# Patient Record
Sex: Female | Born: 1965 | Race: White | Hispanic: No | Smoking: Current every day smoker
Health system: Southern US, Community
[De-identification: ages and names within clinical notes are randomized; demographics above are authoritative.]

## PROBLEM LIST (undated history)

## (undated) DIAGNOSIS — Z8619 Personal history of other infectious and parasitic diseases: Secondary | ICD-10-CM

## (undated) DIAGNOSIS — M1990 Unspecified osteoarthritis, unspecified site: Secondary | ICD-10-CM

## (undated) DIAGNOSIS — K432 Incisional hernia without obstruction or gangrene: Secondary | ICD-10-CM

## (undated) DIAGNOSIS — I10 Essential (primary) hypertension: Secondary | ICD-10-CM

## (undated) DIAGNOSIS — M810 Age-related osteoporosis without current pathological fracture: Secondary | ICD-10-CM

## (undated) HISTORY — PX: BACK SURGERY: SHX140

## (undated) HISTORY — DX: Unspecified osteoarthritis, unspecified site: M19.90

## (undated) HISTORY — PX: CHOLECYSTECTOMY: SHX55

## (undated) HISTORY — DX: Essential (primary) hypertension: I10

---

## 2009-02-08 ENCOUNTER — Emergency Department (HOSPITAL_BASED_OUTPATIENT_CLINIC_OR_DEPARTMENT_OTHER): Admission: EM | Admit: 2009-02-08 | Discharge: 2009-02-08 | Payer: Self-pay | Admitting: Emergency Medicine

## 2010-05-29 ENCOUNTER — Emergency Department: Payer: Self-pay | Admitting: Emergency Medicine

## 2014-03-22 ENCOUNTER — Emergency Department (HOSPITAL_COMMUNITY)
Admission: EM | Admit: 2014-03-22 | Discharge: 2014-03-22 | Discharge status: Against Medical Advice | Payer: BC Managed Care – PPO | Attending: Emergency Medicine | Admitting: Emergency Medicine

## 2014-03-22 ENCOUNTER — Encounter (HOSPITAL_COMMUNITY): Payer: Self-pay

## 2014-03-22 DIAGNOSIS — R109 Unspecified abdominal pain: Secondary | ICD-10-CM | POA: Insufficient documentation

## 2014-03-22 DIAGNOSIS — Z720 Tobacco use: Secondary | ICD-10-CM | POA: Insufficient documentation

## 2014-03-22 NOTE — ED Notes (Signed)
Pt says she needs to leave, she will see md in the am.

## 2014-03-22 NOTE — ED Notes (Signed)
My stomach is hurting, I have blood in my stool per pt. It has been bothering me for two days.

## 2014-03-23 ENCOUNTER — Emergency Department (HOSPITAL_COMMUNITY)
Admission: EM | Admit: 2014-03-23 | Discharge: 2014-03-23 | Disposition: A | Discharge status: Home / Self Care | Payer: BC Managed Care – PPO | Attending: Emergency Medicine | Admitting: Emergency Medicine

## 2014-03-23 ENCOUNTER — Emergency Department (HOSPITAL_COMMUNITY): Payer: BC Managed Care – PPO

## 2014-03-23 ENCOUNTER — Encounter (HOSPITAL_COMMUNITY): Payer: Self-pay | Admitting: *Deleted

## 2014-03-23 DIAGNOSIS — R1031 Right lower quadrant pain: Secondary | ICD-10-CM | POA: Diagnosis present

## 2014-03-23 DIAGNOSIS — R109 Unspecified abdominal pain: Secondary | ICD-10-CM

## 2014-03-23 DIAGNOSIS — Z3202 Encounter for pregnancy test, result negative: Secondary | ICD-10-CM | POA: Insufficient documentation

## 2014-03-23 DIAGNOSIS — K469 Unspecified abdominal hernia without obstruction or gangrene: Secondary | ICD-10-CM | POA: Insufficient documentation

## 2014-03-23 DIAGNOSIS — K439 Ventral hernia without obstruction or gangrene: Secondary | ICD-10-CM

## 2014-03-23 DIAGNOSIS — R195 Other fecal abnormalities: Secondary | ICD-10-CM | POA: Insufficient documentation

## 2014-03-23 DIAGNOSIS — Z9049 Acquired absence of other specified parts of digestive tract: Secondary | ICD-10-CM | POA: Diagnosis not present

## 2014-03-23 DIAGNOSIS — R1010 Upper abdominal pain, unspecified: Secondary | ICD-10-CM

## 2014-03-23 DIAGNOSIS — Z720 Tobacco use: Secondary | ICD-10-CM | POA: Diagnosis not present

## 2014-03-23 DIAGNOSIS — M545 Low back pain: Secondary | ICD-10-CM | POA: Insufficient documentation

## 2014-03-23 LAB — COMPREHENSIVE METABOLIC PANEL
ALT: 14 U/L (ref 0–35)
AST: 17 U/L (ref 0–37)
Albumin: 3.7 g/dL (ref 3.5–5.2)
Alkaline Phosphatase: 84 U/L (ref 39–117)
Anion gap: 12 (ref 5–15)
BUN: 9 mg/dL (ref 6–23)
CALCIUM: 9.2 mg/dL (ref 8.4–10.5)
CO2: 26 meq/L (ref 19–32)
Chloride: 102 mEq/L (ref 96–112)
Creatinine, Ser: 0.63 mg/dL (ref 0.50–1.10)
GFR calc Af Amer: >90 mL/min (ref >90)
Glucose, Bld: 94 mg/dL (ref 70–99)
POTASSIUM: 4 meq/L (ref 3.7–5.3)
SODIUM: 140 meq/L (ref 137–147)
Total Bilirubin: 0.4 mg/dL (ref 0.3–1.2)
Total Protein: 7.4 g/dL (ref 6.0–8.3)

## 2014-03-23 LAB — URINALYSIS, ROUTINE W REFLEX MICROSCOPIC
Bilirubin Urine: NEGATIVE
Glucose, UA: NEGATIVE mg/dL
Ketones, ur: NEGATIVE mg/dL
Nitrite: POSITIVE — AB
PH: 7 (ref 5.0–8.0)
Protein, ur: NEGATIVE mg/dL
Specific Gravity, Urine: 1.01 (ref 1.005–1.030)
Urobilinogen, UA: 0.2 mg/dL (ref 0.0–1.0)

## 2014-03-23 LAB — POC URINE PREG, ED: PREG TEST UR: NEGATIVE

## 2014-03-23 LAB — I-STAT CHEM 8, ED
BUN: 7 mg/dL (ref 6–23)
Calcium, Ion: 1.14 mmol/L (ref 1.12–1.23)
Chloride: 103 mEq/L (ref 96–112)
Creatinine, Ser: 0.6 mg/dL (ref 0.50–1.10)
Glucose, Bld: 93 mg/dL (ref 70–99)
HCT: 46 % (ref 36.0–46.0)
Hemoglobin: 15.6 g/dL — ABNORMAL HIGH (ref 12.0–15.0)
Potassium: 3.8 mEq/L (ref 3.7–5.3)
Sodium: 138 mEq/L (ref 137–147)
TCO2: 25 mmol/L (ref 0–100)

## 2014-03-23 LAB — URINE MICROSCOPIC-ADD ON

## 2014-03-23 LAB — CBC WITH DIFFERENTIAL/PLATELET
Basophils Absolute: 0.1 10*3/uL (ref 0.0–0.1)
Basophils Relative: 1 % (ref 0–1)
Eosinophils Absolute: 0.3 10*3/uL (ref 0.0–0.7)
Eosinophils Relative: 4 % (ref 0–5)
HCT: 42.9 % (ref 36.0–46.0)
Hemoglobin: 14.9 g/dL (ref 12.0–15.0)
LYMPHS PCT: 27 % (ref 12–46)
Lymphs Abs: 1.8 10*3/uL (ref 0.7–4.0)
MCH: 32.6 pg (ref 26.0–34.0)
MCHC: 34.7 g/dL (ref 30.0–36.0)
MCV: 93.9 fL (ref 78.0–100.0)
Monocytes Absolute: 0.4 10*3/uL (ref 0.1–1.0)
Monocytes Relative: 6 % (ref 3–12)
NEUTROS PCT: 62 % (ref 43–77)
Neutro Abs: 4.3 10*3/uL (ref 1.7–7.7)
PLATELETS: 290 10*3/uL (ref 150–400)
RBC: 4.57 MIL/uL (ref 3.87–5.11)
RDW: 13.3 % (ref 11.5–15.5)
WBC: 6.8 10*3/uL (ref 4.0–10.5)

## 2014-03-23 LAB — TROPONIN I

## 2014-03-23 LAB — LIPASE, BLOOD: Lipase: 13 U/L (ref 11–59)

## 2014-03-23 LAB — I-STAT CG4 LACTIC ACID, ED: Lactic Acid, Venous: 0.54 mmol/L (ref 0.5–2.2)

## 2014-03-23 MED ORDER — SODIUM CHLORIDE 0.9 % IV BOLUS (SEPSIS)
1000.0000 mL | Freq: Once | INTRAVENOUS | Status: AC
  Administered 2014-03-23: 1000 mL via INTRAVENOUS

## 2014-03-23 MED ORDER — FENTANYL CITRATE 0.05 MG/ML IJ SOLN
100.0000 ug | Freq: Once | INTRAMUSCULAR | Status: AC
  Administered 2014-03-23: 100 ug via INTRAVENOUS
  Filled 2014-03-23: qty 2

## 2014-03-23 MED ORDER — IOHEXOL 300 MG/ML  SOLN
100.0000 mL | Freq: Once | INTRAMUSCULAR | Status: AC | PRN
  Administered 2014-03-23: 100 mL via INTRAVENOUS

## 2014-03-23 MED ORDER — NAPROXEN 500 MG PO TABS: 500.0000 mg | ORAL_TABLET | Freq: Two times a day (BID) | ORAL | Status: DC

## 2014-03-23 MED ORDER — HYDROCODONE-ACETAMINOPHEN 5-325 MG PO TABS: 2.0000 | ORAL_TABLET | ORAL | Status: DC | PRN

## 2014-03-23 NOTE — Discharge Instructions (Signed)

## 2014-03-23 NOTE — ED Notes (Addendum)
Pt co abdominal pain, lower back pain, and nausea, since Sunday. Seen for last same last night but had to leave. Pt seen at urgent care this morning and sent to ED for possible mass in abdomen. Pt describes pain as a dull ache.

## 2014-03-23 NOTE — ED Notes (Signed)
Patient given discharge instruction, verbalized understand. IV removed, band aid applied. Patient ambulatory out of the department.  

## 2014-03-23 NOTE — ED Provider Notes (Signed)
CSN: [id number]     Arrival date & time 03/23/14  0904 History  This chart was scribed for Jocelyn RollerBrian D Daking Westervelt, MD by Jocelyn [name], ED Scribe. This patient was seen in room APA14/APA14 and the patient's care was started at 9:45 AM.    Chief Complaint  Patient presents with  . Abdominal Pain   The history is provided by the patient. No language interpreter was used.     HPI Comments: Jocelyn [name] is a 48 y.o. female with past medical history of cholecystectomy 8 years PTA who presents to the Emergency Department complaining of 4 days of abdominal pain, lower back pain, and nausea associated with a knot in her mid-upper abdomen. She describes her abdominal pain as a dull ache. Pain has been persistent, gradually worsening. She reports one episode of rectal bleeding, occuring yesterday. She had a normal stool after that incident.   She was seen here last night for the same symptoms but "had to leave" - she visited Urgent Care this morning and was sent to the ED for a possible abdominal mass.   History reviewed. No pertinent past medical history. History reviewed. No pertinent past surgical history. History reviewed. No pertinent family history. History  Substance Use Topics  . Smoking status: Current Every Day Smoker  . Smokeless tobacco: Not on file  . Alcohol Use: No   OB History    No data available     Review of Systems  Gastrointestinal: Positive for nausea, abdominal pain and blood in stool.  All other systems reviewed and are negative.  Allergies  Tetracyclines & related  Home Medications   Prior to Admission medications   Medication Sig Start Date End Date Taking? Authorizing Provider  ibuprofen (ADVIL,MOTRIN) 200 MG tablet Take 600-800 mg by mouth every 6 (six) hours as needed for moderate pain.   Yes Historical Provider, MD  HYDROcodone-acetaminophen (NORCO/VICODIN) 5-325 MG per tablet Take 2 tablets by mouth every 4 (four) hours as needed. 03/23/14   Jocelyn RollerBrian D Jocelyn Estey, MD   naproxen (NAPROSYN) 500 MG tablet Take 1 tablet (500 mg total) by mouth 2 (two) times daily with a meal. 03/23/14   Jocelyn RollerBrian D Jocelyn Sperling, MD   BP 122/88 mmHg  Pulse 80  Temp(Src) 97.7 F (36.5 C) (Oral)  Resp 16  Ht 5\' 4"  (1.626 m)  Wt 143 lb (64.864 kg)  BMI 24.53 kg/m2  SpO2 100% Physical Exam  Constitutional: She is oriented to person, place, and time. She appears well-developed and well-nourished. No distress.  HENT:  Head: Normocephalic and atraumatic.  Mouth/Throat: Oropharynx is clear and moist. No oropharyngeal exudate.  Eyes: Conjunctivae and EOM are normal. Pupils are equal, round, and reactive to light. Right eye exhibits no discharge. Left eye exhibits no discharge. No scleral icterus.  Neck: Normal range of motion. Neck supple. No JVD present. No tracheal deviation present. No thyromegaly present.  Cardiovascular: Normal rate, regular rhythm, normal heart sounds and intact distal pulses.  Exam reveals no gallop and no friction rub.   No murmur heard. Pulmonary/Chest: Effort normal and breath sounds normal. No respiratory distress. She has no wheezes. She has no rales.  Abdominal: Soft. Bowel sounds are normal. She exhibits no distension and no mass. There is tenderness. There is guarding.  Supraumbilical mass which is hard and tender; mild RLQ and RU tenderness as well, no left-sided tenderness, guarding present, not peritoneal   Musculoskeletal: Normal range of motion. She exhibits no edema or tenderness.  Lymphadenopathy:  She has no cervical adenopathy.  Neurological: She is alert and oriented to person, place, and time. Coordination normal.  Skin: Skin is warm and dry. No rash noted. No erythema.  Psychiatric: She has a normal mood and affect. Her behavior is normal.  Nursing note and vitals reviewed.  ED Course  Procedures   DIAGNOSTIC STUDIES: Oxygen Saturation is 100% on RA, normal by my interpretation.    COORDINATION OF CARE: 9:56 AM Discussed treatment plan  with pt at bedside and pt agreed to plan.   Labs Review Labs Reviewed  URINALYSIS, ROUTINE W REFLEX MICROSCOPIC - Abnormal; Notable for the following:    Hgb urine dipstick TRACE (*)    Nitrite POSITIVE (*)    Leukocytes, UA TRACE (*)    All other components within normal limits  URINE MICROSCOPIC-ADD ON - Abnormal; Notable for the following:    Bacteria, UA MANY (*)    All other components within normal limits  I-STAT CHEM 8, ED - Abnormal; Notable for the following:    Hemoglobin 15.6 (*)    All other components within normal limits  CBC WITH DIFFERENTIAL  COMPREHENSIVE METABOLIC PANEL  LIPASE, BLOOD  TROPONIN I  POC URINE PREG, ED  I-STAT CHEM 8, ED  I-STAT CG4 LACTIC ACID, ED    Imaging Review Ct Abdomen Pelvis W Contrast  03/23/2014   CLINICAL DATA:  Lower back pain with nausea.  Rectal bleeding.  EXAM: CT ABDOMEN AND PELVIS WITH CONTRAST  TECHNIQUE: Multidetector CT imaging of the abdomen and pelvis was performed using the standard protocol following bolus administration of intravenous contrast.  CONTRAST:  OMNIPAQUE IOHEXOL 300 MG/ML  SOLN  COMPARISON:  None.  FINDINGS: The lung bases are clear.  The liver demonstrates no focal abnormality. There is no intrahepatic or extrahepatic biliary ductal dilatation. The gallbladder is surgically absent. The spleen demonstrates no focal abnormality.There is a 12 mm hypodense fluid attenuating right renal mass most consistent with a cyst. There is a 3 x 1.5 cm hypodense, fluid attenuating left renal posterior interpolar mass most consistent with a cyst. The adrenal glands and pancreas are normal. The bladder is unremarkable.  The stomach, duodenum, small intestine, and large intestine demonstrate no wall thickening or bowel dilatation. There is diverticulosis without evidence of diverticulitis. There is a normal caliber appendix in the right lower quadrant without periappendiceal inflammatory changes.There is a small fat containing  umbilical hernia. There is no pneumoperitoneum, pneumatosis, or portal venous gas. There is no abdominal or pelvic free fluid. There is no lymphadenopathy.  The abdominal aorta is normal in caliber with atherosclerosis.  There are no lytic or sclerotic osseous lesions. There is degenerative disc disease at L5-S1 with disc space narrowing a broad-based disc bulge.  IMPRESSION: 1. Diverticulosis without evidence of diverticulitis. 2. Small fat containing umbilical hernia.   Electronically Signed   By: Elige Ko   On: 03/23/2014 10:34      MDM   Final diagnoses:  Abdominal pain  Hernia of anterior abdominal wall    The patient is well-appearing, she is in no acute distress, she does not have a surgical abdomen, her laboratory workup shows no leukocytosis, normal lactic acid, normal renal function and no anemia. Her CT scan shows that she has had small piece of herniated fat in the periumbilical region. I have discussed this with Dr. Lovell Sheehan who agrees that this does not need to be reduced and that she can follow-up in the general surgeon's office for follow-up with  pain control but no need for antibiotics. This will be explained to the patient, she otherwise appears stable.   I personally performed the services described in this documentation, which was scribed in my presence. The recorded information has been reviewed and is accurate.     Jocelyn RollerBrian D Jamisen Hawes, MD 03/24/14 [id number]

## 2014-04-13 NOTE — H&P (Signed)
  NTS SOAP Note  Vital Signs:  Vitals as of: 04/12/2014: Systolic 137: Diastolic 81: Heart Rate 83: Temp 98.72F: Height 25ft 4in: Weight 166Lbs 0 Ounces: BMI 28.49  BMI : 28.49 kg/m2  Subjective: This 48 year old female presents for of a swelling just above the umbilicus.  Has been present for some time,  but is increasing in size and causing her discomfort.  Made worse with straining.  Had surgery thru umbilicus for cholecystectomy.  Review of Symptoms:  Constitutional:unremarkable   headache Eyes:unremarkable   Nose/Mouth/Throat:unremarkable Cardiovascular:  unremarkable Respiratory:cough Gastrointestinabdominal pain, heartburn Genitourinary:frequency back pain Skin:unremarkable Hematolgic/Lymphatic:unremarkable   Allergic/Immunologic:unremarkable   Past Medical History:  Reviewed  Past Medical History  Surgical History: lap cholecystectomy 2007 Medical Problems: none Allergies: tetracycline Medications: motrin,  vicodin   Social History:Reviewed  Social History  Preferred Language: English Race:  White Ethnicity: Not Hispanic / Latino Age: [age] year Marital Status:  S Alcohol: unknown   Smoking Status: Never smoker reviewed on 04/12/2014 Functional Status reviewed on 04/12/2014 ------------------------------------------------ Bathing: Normal Cooking: Normal Dressing: Normal Driving: Normal Eating: Normal Managing Meds: Normal Oral Care: Normal Shopping: Normal Toileting: Normal Transferring: Normal Walking: Normal Cognitive Status reviewed on 04/12/2014 ------------------------------------------------ Attention: Normal Decision Making: Normal Language: Normal Memory: Normal Motor: Normal Perception: Normal Problem Solving: Normal Visual and Spatial: Normal   Family History:Reviewed  Family Health History Mother  Father, Living; Hypertension (high blood pressure);     Objective Information: General:Well appearing, well  nourished in no distress. Heart:RRR, no murmur or gallop.  Normal S1, S2.  No S3, S4.  Lungs:  CTA bilaterally, no wheezes, rhonchi, rales.  Breathing unlabored. Abdomen:Soft, NT/ND, no HSM, no masses.  Reducible periumbilical hernia present along with a surgical scar.  Assessment:Incisional hernia  Diagnoses: 553.21  K43.2 Incisional hernia (Incisional hernia without obstruction or gangrene)  Procedures: [id number] - OFFICE OUTPATIENT NEW 30 MINUTES    Plan:  Scheduled for incisional herniorrhaphy with mesh on 04/27/14.   Patient Education:Alternative treatments to surgery were discussed with patient (and family).  Risks and benefits  of procedure including bleeding,  infection,  mesh use,  and recurrence of the hernia were fully explained to the patient (and family) who gave informed consent. Patient/family questions were addressed.  Follow-up:Pending Surgery

## 2014-04-20 NOTE — Patient Instructions (Signed)
Jocelyn BogaDee A Courington  04/20/2014   Your procedure is scheduled on:  04/27/2014  [facility]  AM.  Call this number if you have problems the morning of surgery: [phone]   Remember:   Do not eat food or drink liquids after midnight.   Take these medicines the morning of surgery with A SIP OF WATER:  hydrocodone   Do not wear jewelry, make-up or nail polish.  Do not wear lotions, powders, or perfumes.   Do not shave 48 hours prior to surgery. Men may shave face and neck.  Do not bring valuables to the hospital.  [facility] is not responsible for any belongings or valuables.               Contacts, dentures or bridgework may not be worn into surgery.  Leave suitcase in the car. After surgery it may be brought to your room.  For patients admitted to the hospital, discharge time is determined by your treatment team.               Patients discharged the day of surgery will not be allowed to drive home.  Name and phone number of your driver: family  Special Instructions: Shower using CHG 2 nights before surgery and the night before surgery.  If you shower the day of surgery use CHG.  Use special wash - you have one bottle of CHG for all showers.  You should use approximately 1/3 of the bottle for each shower.   Please read over the following fact sheets that you were given: Pain Booklet, Coughing and Deep Breathing, Surgical Site Infection Prevention, Anesthesia Post-op Instructions and Care and Recovery After Surgery Hernia A hernia occurs when an internal organ pushes out through a weak spot in the abdominal wall. Hernias most commonly occur in the groin and around the navel. Hernias often can be pushed back into place (reduced). Most hernias tend to get worse over time. Some abdominal hernias can get stuck in the opening (irreducible or incarcerated hernia) and cannot be reduced. An irreducible abdominal hernia which is tightly squeezed into the opening is at risk for  impaired blood supply (strangulated hernia). A strangulated hernia is a medical emergency. Because of the risk for an irreducible or strangulated hernia, surgery may be recommended to repair a hernia. CAUSES   Heavy lifting.  Prolonged coughing.  Straining to have a bowel movement.  A cut (incision) made during an abdominal surgery. HOME CARE INSTRUCTIONS   Bed rest is not required. You may continue your normal activities.  Avoid lifting more than 10 pounds (4.5 kg) or straining.  Cough gently. If you are a smoker it is best to stop. Even the best hernia repair can break down with the continual strain of coughing. Even if you do not have your hernia repaired, a cough will continue to aggravate the problem.  Do not wear anything tight over your hernia. Do not try to keep it in with an outside bandage or truss. These can damage abdominal contents if they are trapped within the hernia sac.  Eat a normal diet.  Avoid constipation. Straining over long periods of time will increase hernia size and encourage breakdown of repairs. If you cannot do this with diet alone, stool softeners may be used. SEEK IMMEDIATE MEDICAL CARE IF:   You have a fever.  You develop increasing abdominal pain.  You feel nauseous or vomit.  Your hernia is stuck  outside the abdomen, looks discolored, feels hard, or is tender.  You have any changes in your bowel habits or in the hernia that are unusual for you.  You have increased pain or swelling around the hernia.  You cannot push the hernia back in place by applying gentle pressure while lying down. MAKE SURE YOU:   Understand these instructions.  Will watch your condition.  Will get help right away if you are not doing well or get worse. Document Released: 04/22/2005 Document Revised: 07/15/2011 Document Reviewed: 12/10/2007 [facility] Patient Information 2015 [place], [place]. This information is not intended to replace advice given to you by your  health care provider. Make sure you discuss any questions you have with your health care provider. PATIENT INSTRUCTIONS POST-ANESTHESIA  IMMEDIATELY FOLLOWING SURGERY:  Do not drive or operate machinery for the first twenty four hours after surgery.  Do not make any important decisions for twenty four hours after surgery or while taking narcotic pain medications or sedatives.  If you develop intractable nausea and vomiting or a severe headache please notify your doctor immediately.  FOLLOW-UP:  Please make an appointment with your surgeon as instructed. You do not need to follow up with anesthesia unless specifically instructed to do so.  WOUND CARE INSTRUCTIONS (if applicable):  Keep a dry clean dressing on the anesthesia/puncture wound site if there is drainage.  Once the wound has quit draining you may leave it open to air.  Generally you should leave the bandage intact for twenty four hours unless there is drainage.  If the epidural site drains for more than 36-48 hours please call the anesthesia department.  QUESTIONS?:  Please feel free to call your physician or the hospital operator if you have any questions, and they will be happy to assist you.

## 2014-04-21 ENCOUNTER — Encounter (HOSPITAL_COMMUNITY)
Admission: RE | Admit: 2014-04-21 | Discharge: 2014-04-21 | Disposition: A | Discharge status: Home / Self Care | Payer: BC Managed Care – PPO | Source: Ambulatory Visit | Attending: General Surgery | Admitting: General Surgery

## 2014-04-21 ENCOUNTER — Encounter (HOSPITAL_COMMUNITY): Payer: Self-pay

## 2014-04-21 DIAGNOSIS — Z01812 Encounter for preprocedural laboratory examination: Secondary | ICD-10-CM | POA: Diagnosis not present

## 2014-04-21 LAB — CBC WITH DIFFERENTIAL/PLATELET
BASOS PCT: 1 % (ref 0–1)
Basophils Absolute: 0.1 10*3/uL (ref 0.0–0.1)
EOS ABS: 0.3 10*3/uL (ref 0.0–0.7)
Eosinophils Relative: 4 % (ref 0–5)
HCT: 40.2 % (ref 36.0–46.0)
Hemoglobin: 13.7 g/dL (ref 12.0–15.0)
Lymphocytes Relative: 30 % (ref 12–46)
Lymphs Abs: 2.2 10*3/uL (ref 0.7–4.0)
MCH: 32.2 pg (ref 26.0–34.0)
MCHC: 34.1 g/dL (ref 30.0–36.0)
MCV: 94.4 fL (ref 78.0–100.0)
MONOS PCT: 6 % (ref 3–12)
Monocytes Absolute: 0.5 10*3/uL (ref 0.1–1.0)
NEUTROS PCT: 59 % (ref 43–77)
Neutro Abs: 4.3 10*3/uL (ref 1.7–7.7)
Platelets: 268 10*3/uL (ref 150–400)
RBC: 4.26 MIL/uL (ref 3.87–5.11)
RDW: 13.2 % (ref 11.5–15.5)
WBC: 7.3 10*3/uL (ref 4.0–10.5)

## 2014-04-21 LAB — BASIC METABOLIC PANEL
Anion gap: 12 (ref 5–15)
BUN: 14 mg/dL (ref 6–23)
CO2: 24 mEq/L (ref 19–32)
CREATININE: 0.66 mg/dL (ref 0.50–1.10)
Calcium: 8.7 mg/dL (ref 8.4–10.5)
Chloride: 105 mEq/L (ref 96–112)
GFR calc non Af Amer: >90 mL/min (ref >90)
Glucose, Bld: 94 mg/dL (ref 70–99)
Potassium: 3.7 mEq/L (ref 3.7–5.3)
Sodium: 141 mEq/L (ref 137–147)

## 2014-04-21 LAB — HCG, SERUM, QUALITATIVE: Preg, Serum: NEGATIVE

## 2014-04-21 NOTE — Pre-Procedure Instructions (Signed)
Patient given information to sign up for my chart at home. 

## 2014-04-27 ENCOUNTER — Encounter (HOSPITAL_COMMUNITY)
Admission: RE | Disposition: A | Discharge status: Home / Self Care | Payer: Self-pay | Source: Ambulatory Visit | Attending: General Surgery

## 2014-04-27 ENCOUNTER — Ambulatory Visit (HOSPITAL_COMMUNITY): Payer: BC Managed Care – PPO | Admitting: Anesthesiology

## 2014-04-27 ENCOUNTER — Ambulatory Visit (HOSPITAL_COMMUNITY)
Admission: RE | Admit: 2014-04-27 | Discharge: 2014-04-27 | Disposition: A | Discharge status: Home / Self Care | Payer: BC Managed Care – PPO | Source: Ambulatory Visit | Attending: General Surgery | Admitting: General Surgery

## 2014-04-27 ENCOUNTER — Encounter (HOSPITAL_COMMUNITY): Payer: Self-pay | Admitting: *Deleted

## 2014-04-27 DIAGNOSIS — K432 Incisional hernia without obstruction or gangrene: Secondary | ICD-10-CM | POA: Insufficient documentation

## 2014-04-27 HISTORY — PX: INSERTION OF MESH: SHX5868

## 2014-04-27 HISTORY — PX: INCISIONAL HERNIA REPAIR: SHX193

## 2014-04-27 SURGERY — REPAIR, HERNIA, INCISIONAL
Anesthesia: General | Site: Abdomen

## 2014-04-27 MED ORDER — NEOSTIGMINE METHYLSULFATE 10 MG/10ML IV SOLN
INTRAVENOUS | Status: AC
  Filled 2014-04-27: qty 1

## 2014-04-27 MED ORDER — BUPIVACAINE HCL (PF) 0.5 % IJ SOLN
INTRAMUSCULAR | Status: AC
  Filled 2014-04-27: qty 30

## 2014-04-27 MED ORDER — KETOROLAC TROMETHAMINE 30 MG/ML IJ SOLN
INTRAMUSCULAR | Status: AC
  Filled 2014-04-27: qty 1

## 2014-04-27 MED ORDER — GLYCOPYRROLATE 0.2 MG/ML IJ SOLN
INTRAMUSCULAR | Status: DC | PRN
  Administered 2014-04-27: .3 mg via INTRAVENOUS

## 2014-04-27 MED ORDER — ONDANSETRON HCL 4 MG/2ML IJ SOLN
INTRAMUSCULAR | Status: AC
  Filled 2014-04-27: qty 2

## 2014-04-27 MED ORDER — NEOSTIGMINE METHYLSULFATE 10 MG/10ML IV SOLN
INTRAVENOUS | Status: DC | PRN
  Administered 2014-04-27: 2 mg via INTRAVENOUS

## 2014-04-27 MED ORDER — FENTANYL CITRATE 0.05 MG/ML IJ SOLN
25.0000 ug | INTRAMUSCULAR | Status: DC | PRN
  Administered 2014-04-27 (×2): 50 ug via INTRAVENOUS

## 2014-04-27 MED ORDER — POVIDONE-IODINE 10 % EX OINT
TOPICAL_OINTMENT | CUTANEOUS | Status: AC
  Filled 2014-04-27: qty 1

## 2014-04-27 MED ORDER — FENTANYL CITRATE 0.05 MG/ML IJ SOLN
INTRAMUSCULAR | Status: AC
  Filled 2014-04-27: qty 2

## 2014-04-27 MED ORDER — LIDOCAINE HCL (PF) 1 % IJ SOLN
INTRAMUSCULAR | Status: AC
  Filled 2014-04-27: qty 5

## 2014-04-27 MED ORDER — KETOROLAC TROMETHAMINE 30 MG/ML IJ SOLN
30.0000 mg | Freq: Once | INTRAMUSCULAR | Status: AC
  Administered 2014-04-27: 30 mg via INTRAVENOUS

## 2014-04-27 MED ORDER — PROPOFOL 10 MG/ML IV EMUL
INTRAVENOUS | Status: AC
  Filled 2014-04-27: qty 20

## 2014-04-27 MED ORDER — LIDOCAINE HCL 1 % IJ SOLN
INTRAMUSCULAR | Status: DC | PRN
  Administered 2014-04-27: 40 mg via INTRADERMAL

## 2014-04-27 MED ORDER — CHLORHEXIDINE GLUCONATE 4 % EX LIQD: 1.0000 "application " | Freq: Once | CUTANEOUS | Status: DC

## 2014-04-27 MED ORDER — SODIUM CHLORIDE 0.9 % IR SOLN
Status: DC | PRN
  Administered 2014-04-27: 1000 mL

## 2014-04-27 MED ORDER — ROCURONIUM BROMIDE 50 MG/5ML IV SOLN
INTRAVENOUS | Status: AC
  Filled 2014-04-27: qty 1

## 2014-04-27 MED ORDER — LACTATED RINGERS IV SOLN
INTRAVENOUS | Status: DC
  Administered 2014-04-27: 1000 mL via INTRAVENOUS

## 2014-04-27 MED ORDER — ONDANSETRON HCL 4 MG/2ML IJ SOLN: 4.0000 mg | Freq: Once | INTRAMUSCULAR | Status: DC | PRN

## 2014-04-27 MED ORDER — MIDAZOLAM HCL 2 MG/2ML IJ SOLN
INTRAMUSCULAR | Status: AC
  Filled 2014-04-27: qty 2

## 2014-04-27 MED ORDER — SUCCINYLCHOLINE CHLORIDE 20 MG/ML IJ SOLN
INTRAMUSCULAR | Status: DC | PRN
  Administered 2014-04-27: 100 mg via INTRAVENOUS

## 2014-04-27 MED ORDER — FENTANYL CITRATE 0.05 MG/ML IJ SOLN
INTRAMUSCULAR | Status: AC
  Filled 2014-04-27: qty 5

## 2014-04-27 MED ORDER — MIDAZOLAM HCL 2 MG/2ML IJ SOLN
1.0000 mg | INTRAMUSCULAR | Status: DC | PRN
  Administered 2014-04-27: 2 mg via INTRAVENOUS

## 2014-04-27 MED ORDER — FENTANYL CITRATE 0.05 MG/ML IJ SOLN
INTRAMUSCULAR | Status: DC | PRN
  Administered 2014-04-27 (×7): 50 ug via INTRAVENOUS

## 2014-04-27 MED ORDER — ROCURONIUM BROMIDE 100 MG/10ML IV SOLN
INTRAVENOUS | Status: DC | PRN
  Administered 2014-04-27: 20 mg via INTRAVENOUS
  Administered 2014-04-27: 5 mg via INTRAVENOUS

## 2014-04-27 MED ORDER — CEFAZOLIN SODIUM-DEXTROSE 2-3 GM-% IV SOLR
INTRAVENOUS | Status: AC
  Filled 2014-04-27: qty 50

## 2014-04-27 MED ORDER — PROPOFOL 10 MG/ML IV BOLUS
INTRAVENOUS | Status: DC | PRN
  Administered 2014-04-27: 140 mg via INTRAVENOUS

## 2014-04-27 MED ORDER — OXYCODONE-ACETAMINOPHEN 7.5-325 MG PO TABS: 1.0000 | ORAL_TABLET | ORAL | Status: DC | PRN

## 2014-04-27 MED ORDER — BUPIVACAINE HCL (PF) 0.5 % IJ SOLN
INTRAMUSCULAR | Status: DC | PRN
  Administered 2014-04-27: 7 mL

## 2014-04-27 MED ORDER — CEFAZOLIN SODIUM-DEXTROSE 2-3 GM-% IV SOLR
2.0000 g | INTRAVENOUS | Status: AC
  Administered 2014-04-27: 2 g via INTRAVENOUS

## 2014-04-27 MED ORDER — ONDANSETRON HCL 4 MG/2ML IJ SOLN
4.0000 mg | Freq: Once | INTRAMUSCULAR | Status: AC
  Administered 2014-04-27: 4 mg via INTRAVENOUS

## 2014-04-27 MED ORDER — POVIDONE-IODINE 10 % OINT PACKET
TOPICAL_OINTMENT | CUTANEOUS | Status: DC | PRN
  Administered 2014-04-27: 1 via TOPICAL

## 2014-04-27 MED ORDER — GLYCOPYRROLATE 0.2 MG/ML IJ SOLN
INTRAMUSCULAR | Status: AC
Start: 2014-04-27 — End: 2014-04-27
  Filled 2014-04-27: qty 2

## 2014-04-27 MED ORDER — SUCCINYLCHOLINE CHLORIDE 20 MG/ML IJ SOLN
INTRAMUSCULAR | Status: AC
  Filled 2014-04-27: qty 1

## 2014-04-27 SURGICAL SUPPLY — 44 items
BAG HAMPER (MISCELLANEOUS) ×3 IMPLANT
CHLORAPREP W/TINT 26ML (MISCELLANEOUS) ×3 IMPLANT
CLOTH BEACON ORANGE TIMEOUT ST (SAFETY) ×3 IMPLANT
COVER LIGHT HANDLE STERIS (MISCELLANEOUS) ×6 IMPLANT
DECANTER SPIKE VIAL GLASS SM (MISCELLANEOUS) ×2 IMPLANT
ELECT REM PT RETURN 9FT ADLT (ELECTROSURGICAL) ×3
ELECTRODE REM PT RTRN 9FT ADLT (ELECTROSURGICAL) ×1 IMPLANT
GAUZE SPONGE 4X4 12PLY STRL (GAUZE/BANDAGES/DRESSINGS) ×3 IMPLANT
GLOVE BIO SURGEON STRL SZ7 (GLOVE) ×2 IMPLANT
GLOVE BIOGEL PI IND STRL 7.0 (GLOVE) IMPLANT
GLOVE BIOGEL PI IND STRL 7.5 (GLOVE) IMPLANT
GLOVE BIOGEL PI IND STRL 8 (GLOVE) IMPLANT
GLOVE BIOGEL PI INDICATOR 7.0 (GLOVE) ×2
GLOVE BIOGEL PI INDICATOR 7.5 (GLOVE) ×4
GLOVE BIOGEL PI INDICATOR 8 (GLOVE) ×4
GLOVE SURG SS PI 7.5 STRL IVOR (GLOVE) ×7 IMPLANT
GOWN STRL REUS W/ TWL XL LVL3 (GOWN DISPOSABLE) ×1 IMPLANT
GOWN STRL REUS W/TWL LRG LVL3 (GOWN DISPOSABLE) ×8 IMPLANT
GOWN STRL REUS W/TWL XL LVL3 (GOWN DISPOSABLE) ×3
INST SET MAJOR GENERAL (KITS) ×3 IMPLANT
KIT ROOM TURNOVER APOR (KITS) ×3 IMPLANT
MANIFOLD NEPTUNE II (INSTRUMENTS) ×3 IMPLANT
MESH VENTRALEX ST 1-7/10 CRC S (Mesh General) ×2 IMPLANT
NDL HYPO 25X1 1.5 SAFETY (NEEDLE) ×1 IMPLANT
NEEDLE HYPO 25X1 1.5 SAFETY (NEEDLE) ×3 IMPLANT
NS IRRIG 1000ML POUR BTL (IV SOLUTION) ×3 IMPLANT
PACK ABDOMINAL MAJOR (CUSTOM PROCEDURE TRAY) ×3 IMPLANT
PAD ARMBOARD 7.5X6 YLW CONV (MISCELLANEOUS) ×3 IMPLANT
SET BASIN LINEN APH (SET/KITS/TRAYS/PACK) ×3 IMPLANT
STAPLER VISISTAT (STAPLE) ×2 IMPLANT
SUT ETHIBOND NAB MO 7 #0 18IN (SUTURE) ×2 IMPLANT
SUT NOVA NAB GS-21 1 T12 (SUTURE) IMPLANT
SUT NOVA NAB GS-22 2 2-0 T-19 (SUTURE) IMPLANT
SUT NOVA NAB GS-26 0 60 (SUTURE) IMPLANT
SUT PROLENE 0 CT 1 CR/8 (SUTURE) IMPLANT
SUT SILK 2 0 (SUTURE)
SUT SILK 2-0 18XBRD TIE 12 (SUTURE) IMPLANT
SUT VIC AB 2-0 CT1 27 (SUTURE) ×3
SUT VIC AB 2-0 CT1 TAPERPNT 27 (SUTURE) ×1 IMPLANT
SUT VIC AB 3-0 SH 27 (SUTURE) ×3
SUT VIC AB 3-0 SH 27X BRD (SUTURE) ×1 IMPLANT
SUT VIC AB 4-0 PS2 27 (SUTURE) IMPLANT
SYR 20CC LL (SYRINGE) ×3 IMPLANT
TAPE CLOTH SURG 4X10 WHT LF (GAUZE/BANDAGES/DRESSINGS) ×2 IMPLANT

## 2014-04-27 NOTE — Op Note (Signed)
Patient:  Jocelyn [name]  DOB:  11/06/1965  MRN:  [id number]   Preop Diagnosis:  Incisional hernia  Postop Diagnosis:  Same  Procedure:  Incisional herniorrhaphy with mesh  Surgeon:  Franky MachoMark Quentin Shorey, M.D.  Anes:  Gen.   Indications:  Patient is a 48 year old white female status post a laparoscopic cholecystectomy in the past at another facility who presents with an umbilical hernia at the surgical site. The risks and benefits of the procedure including bleeding, infection, recurrence of the hernia were fully explained to the patient, who gave informed consent.  Procedure note:  The patient was placed the supine position. After general anesthesia was administered, the abdomen was prepped and draped using usual sterile technique with ChloraPrep. Surgical site confirmation was performed.  An infraumbilical incision was made down to the fascia. The umbilicus was freed away from the underlying fascia. A 1 cm hernia defect was noted. Pro peritoneal fat as well as omentum were freed away from the edges and reduced. A 4.3 cm Bard Ventralex st patch was then inserted and secured to the fascia using 0 Ethibond interrupted sutures. The overlying fascia was reapproximated transversely using 0 Ethibond interrupted sutures. The base the umbilicus was secured back to the fascia using a 2-0 Vicryl suture. 0.5% Sensorcaine was instilled the surrounding wound. The incision was closed using staples. Betadine ointment dry sterile dressing were applied.  All tape and needle counts were correct at the end of the procedure. Patient was awakened and transferred to PACU in stable condition.  Complications:  None  EBL:  Minimal  Specimen:  None

## 2014-04-27 NOTE — Discharge Instructions (Signed)
Open Hernia Repair, Care After °Refer to this sheet in the next few weeks. These instructions provide you with information on caring for yourself after your procedure. Your health care provider may also give you more specific instructions. Your treatment has been planned according to current medical practices, but problems sometimes occur. Call your health care provider if you have any problems or questions after your procedure. °WHAT TO EXPECT AFTER THE PROCEDURE °After your procedure, it is typical to have the following: °· Pain in your abdomen, especially along your incision. You will be given pain medicines to control the pain. °· Constipation. You may be given a stool softener to help prevent this. °HOME CARE INSTRUCTIONS  °· Only take over-the-counter or prescription medicines as directed by your health care provider. °· Keep the wound dry and clean. You may wash the wound gently with soap and water 48 hours after surgery. Gently blot or dab the wound dry. Do not take baths, use swimming pools, or use hot tubs for 10 days or until your health care provider approves. °· Change bandages (dressings) as directed by your health care provider. °· Continue your normal diet as directed by your health care provider. Eat plenty of fruits and vegetables to help prevent constipation. °· Drink enough fluids to keep your urine clear or pale yellow. This also helps prevent constipation. °· Do not drive until your health care provider says it is okay. °· Do not lift anything heavier than 10 pounds (4.5 kg) or play contact sports for 4 weeks or until your health care provider approves. °· Follow up with your health care provider as directed. Ask your health care provider when to make an appointment to have your stitches (sutures) or staples removed. °SEEK MEDICAL CARE IF:  °· You have increased bleeding coming from the incision site. °· You have blood in your stool. °· You have increasing pain in the wound. °· You see redness  or swelling in the wound. °· You have fluid (pus) coming from the wound. °· You have a fever. °· You notice a bad smell coming from the wound or dressing. °SEEK IMMEDIATE MEDICAL CARE IF:  °· You develop a rash. °· You have chest pain or shortness of breath. °· You feel lightheaded or feel faint. °Document Released: 11/09/2004 Document Revised: 02/10/2013 Document Reviewed: 12/02/2012 °[employer]® Patient Information ©2015 [employer]. This information is not intended to replace advice given to you by your health care provider. Make sure you discuss any questions you have with your health care provider. ° °

## 2014-04-27 NOTE — Anesthesia Procedure Notes (Signed)
Procedure Name: Intubation Date/Time: 04/27/2014 8:34 AM Performed by: Glynn OctaveANIEL, Jocelyn Blumenthal E Pre-anesthesia Checklist: Patient identified, Patient being monitored, Timeout performed, Emergency Drugs available and Suction available Patient Re-evaluated:Patient Re-evaluated prior to inductionOxygen Delivery Method: Circle System Utilized Preoxygenation: Pre-oxygenation with 100% oxygen Intubation Type: IV induction, Rapid sequence and Cricoid Pressure applied Ventilation: Mask ventilation without difficulty Laryngoscope Size: Mac and 3 Grade View: Grade I Tube type: Oral Tube size: 7.0 mm Number of attempts: 1 Airway Equipment and Method: stylet Placement Confirmation: ETT inserted through vocal cords under direct vision,  positive ETCO2 and breath sounds checked- equal and bilateral Secured at: 20 cm Tube secured with: Tape Dental Injury: Teeth and Oropharynx as per pre-operative assessment

## 2014-04-27 NOTE — Transfer of Care (Signed)
Immediate Anesthesia Transfer of Care Note  Patient: Jocelyn [name]  Procedure(s) Performed: Procedure(s): INCISIONAL HERNIORRHAPHY WITH MESH (N/A) INSERTION OF MESH  Patient Location: PACU  Anesthesia Type:General  Level of Consciousness: awake and alert   Airway & Oxygen Therapy: Patient Spontanous Breathing and Patient connected to face mask oxygen  Post-op Assessment: Report given to PACU RN  Post vital signs: Reviewed and stable  Complications: No apparent anesthesia complications

## 2014-04-27 NOTE — Anesthesia Preprocedure Evaluation (Signed)
Anesthesia Evaluation  Patient identified by MRN, date of birth, ID band Patient awake    Reviewed: Allergy & Precautions, H&P , NPO status , Patient's Chart, lab work & pertinent test results  Airway Mallampati: I  TM Distance: >3 FB     Dental  (+) Teeth Intact, Dental Advisory Given   Pulmonary Current Smoker,  breath sounds clear to auscultation        Cardiovascular negative cardio ROS  Rhythm:Regular Rate:Normal     Neuro/Psych    GI/Hepatic GERD-  Controlled,  Endo/Other    Renal/GU      Musculoskeletal   Abdominal   Peds  Hematology   Anesthesia Other Findings   Reproductive/Obstetrics                             Anesthesia Physical Anesthesia Plan  ASA: II  Anesthesia Plan: General   Post-op Pain Management:    Induction: Intravenous, Rapid sequence and Cricoid pressure planned  Airway Management Planned: Oral ETT  Additional Equipment:   Intra-op Plan:   Post-operative Plan: Extubation in OR  Informed Consent: I have reviewed the patients History and Physical, chart, labs and discussed the procedure including the risks, benefits and alternatives for the proposed anesthesia with the patient or authorized representative who has indicated his/her understanding and acceptance.     Plan Discussed with:   Anesthesia Plan Comments:         Anesthesia Quick Evaluation

## 2014-04-27 NOTE — Interval H&P Note (Signed)
History and Physical Interval Note:  04/27/2014 8:17 AM  Jocelyn [name]  has presented today for surgery, with the diagnosis of incisional hernia  The various methods of treatment have been discussed with the patient and family. After consideration of risks, benefits and other options for treatment, the patient has consented to  Procedure(s): HERNIA REPAIR INCISIONAL WITH MESH (N/A) as a surgical intervention .  The patient's history has been reviewed, patient examined, no change in status, stable for surgery.  I have reviewed the patient's chart and labs.  Questions were answered to the patient's satisfaction.     Franky MachoJENKINS,Michaeline Eckersley A

## 2014-04-27 NOTE — Anesthesia Postprocedure Evaluation (Signed)
  Anesthesia Post-op Note  Patient: Jocelyn [name]  Procedure(s) Performed: Procedure(s): INCISIONAL HERNIORRHAPHY WITH MESH (N/A) INSERTION OF MESH  Patient Location: PACU  Anesthesia Type:General  Level of Consciousness: awake and alert   Airway and Oxygen Therapy: Patient Spontanous Breathing and Patient connected to face mask oxygen  Post-op Pain: moderate  Post-op Assessment: Post-op Vital signs reviewed, Patient's Cardiovascular Status Stable, Respiratory Function Stable, Patent Airway and No signs of Nausea or vomiting  Post-op Vital Signs: Reviewed and stable  Last Vitals:  Filed Vitals:   04/27/14 0920  BP: 142/84  Pulse: 77  Temp: 36.6 C  Resp: 16    Complications: No apparent anesthesia complications

## 2014-05-02 ENCOUNTER — Encounter (HOSPITAL_COMMUNITY): Payer: Self-pay | Admitting: General Surgery

## 2015-03-07 ENCOUNTER — Telehealth: Payer: Self-pay | Admitting: Vascular Surgery

## 2015-03-07 NOTE — Telephone Encounter (Signed)
LM for pt to call and confirm appt.

## 2015-03-07 NOTE — Telephone Encounter (Signed)
-----   Message from Phillips Odorarol S Pullins, RN sent at 03/01/2015  1:25 PM EDT ----- Regarding: needs consult with Dr. Edilia Boickson Please schedule for new pt. consult with Dr. Edilia Boickson prior to ALIF on 03/23/15.  Please remind pt. To bring copy of LS Spine films to appt.

## 2015-03-13 ENCOUNTER — Encounter: Payer: Self-pay | Admitting: Vascular Surgery

## 2015-03-15 ENCOUNTER — Encounter: Payer: Self-pay | Admitting: Vascular Surgery

## 2015-03-28 ENCOUNTER — Telehealth: Payer: Self-pay | Admitting: Vascular Surgery

## 2015-03-28 NOTE — Telephone Encounter (Signed)
-----   Message from Phillips Odorarol S Pullins, RN sent at 03/28/2015 12:42 PM EST ----- Regarding: needs consult with [facility] Contact: [phone] Please schedule for consult with Dr. Edilia Boickson, prior to ALIF on 04/20/15.  ([facility] is assisting Dr. Shon BatonBrooks) please remind pt. To bring copy of LS spine film to appt.

## 2015-03-28 NOTE — Telephone Encounter (Signed)
LM for pt to call for appt info, dpm °

## 2015-04-03 ENCOUNTER — Ambulatory Visit: Payer: Self-pay | Admitting: Physician Assistant

## 2015-04-03 ENCOUNTER — Encounter: Payer: Self-pay | Admitting: Vascular Surgery

## 2015-04-04 ENCOUNTER — Telehealth: Payer: Self-pay | Admitting: Vascular Surgery

## 2015-04-04 NOTE — Telephone Encounter (Signed)
-----   Message from Chuck Hinthristopher S Dickson, MD sent at 04/03/2015  9:46 AM EST ----- Regarding: RE: needs consult with [facility] Contact: [phone] You can book the apt on 12/7.  Thanks Thayer Ohmhris  ----- Message -----    From: Fredrich Birksana P Millikan    Sent: 03/28/2015   3:43 PM      To: Chuck Hinthristopher S Dickson, MD Subject: FW: needs consult with [facility]                     Dr Martie Roundickson,  Carol asked that I schedule Ms Ardelle AntonDee Bronder to see you prior to her ALIF on 04/20/15. Your schedule is already full for 11/30, as well as on 12/07.  On 12/07, you have 1 overbook already that we are working to reschedule. Is it OK to put Ms Roxan HockeyRobinson during your lunch time to keep from overbooking again on your schedule, or would you prefer to overbook when a PA is here to assist in the afternoon. (The ALIF evaluations are considered a new patient, which means they are booked as 30 minute appointments.)  Thank you Annabelle Harmanana ----- Message -----    From: Phillips Odorarol S Pullins, RN    Sent: 03/28/2015  12:42 PM      To: [facility] Pool Subject: needs consult with [facility]                         Please schedule for consult with Dr. Edilia Boickson, prior to ALIF on 04/20/15.  ([facility] is assisting Dr. Shon BatonBrooks) please remind pt. To bring copy of LS spine film to appt.

## 2015-04-05 ENCOUNTER — Other Ambulatory Visit: Payer: Self-pay

## 2015-04-07 ENCOUNTER — Encounter: Payer: Self-pay | Admitting: Vascular Surgery

## 2015-04-12 ENCOUNTER — Encounter: Payer: Self-pay | Admitting: Vascular Surgery

## 2015-04-12 ENCOUNTER — Ambulatory Visit (INDEPENDENT_AMBULATORY_CARE_PROVIDER_SITE_OTHER): Payer: Managed Care, Other (non HMO) | Admitting: Vascular Surgery

## 2015-04-12 VITALS — BP 120/84 | HR 91 | Temp 98.0°F | Resp 18 | Ht 64.0 in | Wt 183.0 lb

## 2015-04-12 DIAGNOSIS — M5136 Other intervertebral disc degeneration, lumbar region: Secondary | ICD-10-CM | POA: Diagnosis not present

## 2015-04-12 NOTE — Progress Notes (Signed)
[facility] [place]  Patient name: Jocelyn [name] MRN: [id number] DOB: 12/20/1965 Sex: female  REASON FOR CONSULT: to evaluate for anterior retroperitoneal exposure of L5-S1. Referred by Dr. Shon BatonBrooks  HPI: Jocelyn [name] is a 49 y.o. female, who is referred for evaluation of her anterior retroperitoneal exposure L5-S1. She has had back pain for over a year now. This came on gradually and she does not remember any specific injury. She has tried physical therapy, injection therapy, and pain medication without significant relief. She is felt to be a good candidate for anterior lumbar interbody fusion at L5-S1.  She has had a previous umbilical hernia repair with mesh. [place] was [place] in [place], [place] by Dr. Lovell SheehanJenkins. She's also had a previous cholecystectomy.  I have reviewed the [facility] that [facility]. She has had a long history of back pain and has undergone previous injection therapy. The injections did not help significantly. She has had moderate to severe pain.  Past Medical History  Diagnosis Date  . Anxiety disorder   . Depression   . Hypertension   . Osteoarthritis     History reviewed. No pertinent family history.  SOCIAL HISTORY: Social History   Social History  . Marital Status: Divorced    Spouse Name: N/A  . Number of Children: N/A  . Years of Education: N/A   Occupational History  . Not on file.   Social History Main Topics  . Smoking status: Former Smoker -- 0.25 packs/day for 30 years    Types: Cigarettes    Quit date: 02/13/2015  . Smokeless tobacco: Never Used  . Alcohol Use: No  . Drug Use: No  . Sexual Activity: Yes    Birth Control/ Protection: None   Other Topics Concern  . Not on file   Social History Narrative    Allergies  Allergen Reactions  . Tetracyclines & Related Anaphylaxis  . Hydrocodone Rash    Current Outpatient Prescriptions  Medication Sig Dispense Refill  .  cyclobenzaprine (FLEXERIL) 10 MG tablet Take 10 mg by mouth 3 (three) times daily as needed for muscle spasms.    Marland Kitchen. ibuprofen (ADVIL,MOTRIN) 200 MG tablet Take 600-800 mg by mouth every 6 (six) hours as needed for moderate pain.    Marland Kitchen. lisinopril (PRINIVIL,ZESTRIL) 10 MG tablet Take 10 mg by mouth daily.    . meloxicam (MOBIC) 15 MG tablet Take 15 mg by mouth daily.    . methocarbamol (ROBAXIN) 750 MG tablet Take 750 mg by mouth.    . oxyCODONE-acetaminophen (PERCOCET) 7.5-325 MG per tablet Take 1-2 tablets by mouth every 4 (four) hours as needed. 50 tablet 0  . ALPRAZolam (XANAX) 0.5 MG tablet Take 0.5 mg by mouth.    . naproxen (NAPROSYN) 500 MG tablet Take 1 tablet (500 mg total) by mouth 2 (two) times daily with a meal. (Patient not taking: Reported on 04/12/2015) 30 tablet 0   No current facility-administered medications for this visit.    REVIEW OF SYSTEMS:  [X]  denotes positive finding, [ ]  denotes negative finding Cardiac  Comments:  Chest pain or chest pressure:    Shortness of breath upon exertion:    Short of breath when lying flat:    Irregular heart rhythm:        Vascular    Pain in calf, thigh, or hip brought on by ambulation:    Pain in feet at night that wakes you up from your sleep:  Blood clot in your veins:    Leg swelling:         Pulmonary    Oxygen at home:    Productive cough:     Wheezing:         Neurologic    Sudden weakness in arms or legs:     Sudden numbness in arms or legs:     Sudden onset of difficulty speaking or slurred speech:    Temporary loss of vision in one eye:     Problems with dizziness:         Gastrointestinal    Blood in stool:     Vomited blood:         Genitourinary    Burning when urinating:     Blood in urine:        Psychiatric    Major depression:         Hematologic    Bleeding problems:    Problems with blood clotting too easily:        Skin    Rashes or ulcers:        Constitutional    Fever or chills:        PHYSICAL EXAM: Filed Vitals:   04/12/15 1204  BP: 120/84  Pulse: 91  Temp: 98 F (36.7 C)  TempSrc: Oral  Resp: 18  Height: 5' 4" (1.626 m)  Weight: 183 lb (83.008 kg)  SpO2: 96%    GENERAL: The patient is a well-nourished female, in no acute distress. The vital signs are documented above. CARDIAC: There is a regular rate and rhythm.  VASCULAR: I do not detect carotid bruits. She has palpable posterior tibial pulses. She has no significant lower extremity swelling. PULMONARY: There is good air exchange bilaterally without wheezing or rales. ABDOMEN: Soft and non-tender with normal pitched bowel sounds. She has a slight hernia above her umbilicus. She has an incision below her umbilicus where she had a previous umbilical hernia repair. MUSCULOSKELETAL: There are no major deformities or cyanosis. NEUROLOGIC: No focal weakness or paresthesias are detected. SKIN: There are no ulcers or rashes noted. PSYCHIATRIC: The patient has a normal affect.  DATA:  I have reviewed her CT scan. I do not see any contraindications to anterior retroperitoneal exposure at the L5-S1 level.  MEDICAL ISSUES:  DEGENERATIVE DISC DISEASE L5-S1: The patient appears to be a reasonable candidate for anterior retroperitoneal exposure of L5-S1. I think we will be able to stay below her mesh and her hernia. I have reviewed our role in exposure of the spine in order to allow anterior lumbar interbody fusion at the appropriate levels. We have discussed the potential complications of surgery, including but not limited to, arterial or venous injury, thrombosis, or bleeding. We have also discussed the potential risks of wound healing problems, the development of a hernia, nerve injury, leg swelling, or other unpredictable medical problems. All the patient's questions were answered and they are agreeable to proceed.  Dickson, Christopher [facility] [place] Beeper: [phone]     

## 2015-04-15 NOTE — Pre-Procedure Instructions (Addendum)
Jocelyn [name]  04/15/2015      [facility] [phone] - [place], [place] - [address] SEC [address] [place] [id number] Phone: [phone] Fax: [phone]    Your procedure is scheduled on 04/20/2015.  Report to [facility] Admitting at 5:30 A.M.  Call this number if you have problems the morning of surgery:  [phone]   Remember:  Do not eat food or drink liquids after midnight.  On Wednesday   Take these medicines the morning of surgery with A SIP OF WATER Percocet, if needed, Robaxin, Flexeril if needed  Stop taking aspirin, ibuprofen, Advil, Motrin, Herbal medications, Fish Oil, Mobic, Naprosyn   Do not wear jewelry, make-up or nail polish.   Do not wear lotions, powders, or perfumes.     Do not shave 48 hours prior to surgery.    Do not bring valuables to the hospital.   [facility] is not responsible for any belongings or valuables.  Contacts, dentures or bridgework may not be worn into surgery.  Leave your suitcase in the car.  After surgery it may be brought to your room.  For patients admitted to the hospital, discharge time will be determined by your treatment team.  Patients discharged the day of surgery will not be allowed to drive home.   Name and phone number of your driver:     Special instructions: Special Instructions: [place] - Preparing for Surgery  Before surgery, you can play an important role.  Because skin is not sterile, your skin needs to be as free of germs as possible.  You can reduce the number of germs on you skin by washing with CHG (chlorahexidine gluconate) soap before surgery.  CHG is an antiseptic cleaner which kills germs and bonds with the skin to continue killing germs even after washing.  Please DO NOT use if you have an allergy to CHG or antibacterial soaps.  If your skin becomes reddened/irritated stop using the CHG and inform your nurse when you arrive  at Short Stay.  Do not shave (including legs and underarms) for at least 48 hours prior to the first CHG shower.  You may shave your face.  Please follow these instructions carefully:   1.  Shower with CHG Soap the night before surgery and the  morning of Surgery.  2.  If you choose to wash your hair, wash your hair first as usual with your  normal shampoo.  3.  After you shampoo, rinse your hair and body thoroughly to remove the  Shampoo.  4.  Use CHG as you would any other liquid soap.  You can apply chg directly to the skin and wash gently with scrungie or a clean washcloth.  5.  Apply the CHG Soap to your body ONLY FROM THE NECK DOWN.    Do not use on open wounds or open sores.  Avoid contact with your eyes, ears, mouth and genitals (private parts).  Wash genitals (private parts)   with your normal soap.  6.  Wash thoroughly, paying special attention to the area where your surgery will be performed.  7.  Thoroughly rinse your body with warm water from the neck down.  8.  DO NOT shower/wash with your normal soap after using and rinsing off   the CHG Soap.  9.  Pat yourself dry with a clean towel.  10.  Wear clean pajamas.            11.  Place clean sheets on your bed the night of your first shower and do not sleep with pets.  Day of Surgery  Do not apply any lotions/deodorants the morning of surgery.  Please wear clean clothes to the hospital/surgery center.  Please read over the following fact sheets that you were given. Pain Booklet, Coughing and Deep Breathing, Blood Transfusion Information, MRSA Information and Surgical Site Infection Prevention

## 2015-04-17 ENCOUNTER — Encounter (HOSPITAL_COMMUNITY): Payer: Self-pay

## 2015-04-17 ENCOUNTER — Other Ambulatory Visit: Payer: Self-pay

## 2015-04-17 ENCOUNTER — Encounter (HOSPITAL_COMMUNITY)
Admission: RE | Admit: 2015-04-17 | Discharge: 2015-04-17 | Disposition: A | Discharge status: Home / Self Care | Payer: Managed Care, Other (non HMO) | Source: Ambulatory Visit | Attending: Orthopedic Surgery | Admitting: Orthopedic Surgery

## 2015-04-17 LAB — ABO/RH: ABO/RH(D): O POS

## 2015-04-17 LAB — BASIC METABOLIC PANEL
ANION GAP: 9 (ref 5–15)
BUN: 7 mg/dL (ref 6–20)
CALCIUM: 8.8 mg/dL — AB (ref 8.9–10.3)
CHLORIDE: 104 mmol/L (ref 101–111)
CO2: 25 mmol/L (ref 22–32)
Creatinine, Ser: 0.68 mg/dL (ref 0.44–1.00)
GFR calc Af Amer: >60 mL/min (ref >60)
GLUCOSE: 104 mg/dL — AB (ref 65–99)
POTASSIUM: 4.1 mmol/L (ref 3.5–5.1)
Sodium: 138 mmol/L (ref 135–145)

## 2015-04-17 LAB — CBC
HCT: 41.4 % (ref 36.0–46.0)
HEMOGLOBIN: 13.8 g/dL (ref 12.0–15.0)
MCH: 32.2 pg (ref 26.0–34.0)
MCHC: 33.3 g/dL (ref 30.0–36.0)
MCV: 96.5 fL (ref 78.0–100.0)
Platelets: 251 10*3/uL (ref 150–400)
RBC: 4.29 MIL/uL (ref 3.87–5.11)
RDW: 13.6 % (ref 11.5–15.5)
WBC: 8.6 10*3/uL (ref 4.0–10.5)

## 2015-04-17 LAB — TYPE AND SCREEN
ABO/RH(D): O POS
ANTIBODY SCREEN: NEGATIVE

## 2015-04-17 LAB — SURGICAL PCR SCREEN
MRSA, PCR: NEGATIVE
Staphylococcus aureus: NEGATIVE

## 2015-04-17 NOTE — Progress Notes (Addendum)
PCP is Dr Nita SellsJohn Hall Denies ever seeing a cardiologist. Denies ever having a card cath, stress test, or echo Denies having a recent EKG. Pt reports she fell and now has bruises noted to her lower back. Area is purple in color, pt states it happened Sat.  Dr Shon BatonBrooks office called and informed.(Spoke with Candy).

## 2015-04-18 NOTE — Progress Notes (Signed)
Anesthesia Chart Review:  Pt is 49 year old female scheduled for L5-S1 anterior lumbar fusion, abdominal exposure on 04/20/2015 with Dr. Shon BatonBrooks and Dr. Edilia Boickson.   PMH includes:  HTN. Former smoker. BMI 31. S/p incisional herniorrhaphy 04/27/14.   Medications include: lisinopril  Preoperative labs reviewed.  A Hcg was not obtained. Pt reports her last normal menstrual period was approximately 07/2014 but she has had spotting since then. Will get a qual hcg DOS to be sure.   EKG 04/17/15: NSR. Possible Left atrial enlargement  If no changes, I anticipate pt can proceed with surgery as scheduled.   Rica Mastngela Christal Lagerstrom, FNP-BC [facility]/Anesthesiology Phone: [phone] 04/18/2015 11:10 AM

## 2015-04-20 ENCOUNTER — Inpatient Hospital Stay (HOSPITAL_COMMUNITY)
Admission: AD | Admit: 2015-04-20 | Discharge: 2015-04-22 | DRG: 460 | Disposition: A | Discharge status: Home / Self Care | Payer: Managed Care, Other (non HMO) | Source: Ambulatory Visit | Attending: Orthopedic Surgery | Admitting: Orthopedic Surgery

## 2015-04-20 ENCOUNTER — Encounter (HOSPITAL_COMMUNITY)
Admission: AD | Disposition: A | Discharge status: Home / Self Care | Payer: Self-pay | Source: Ambulatory Visit | Attending: Orthopedic Surgery

## 2015-04-20 ENCOUNTER — Inpatient Hospital Stay (HOSPITAL_COMMUNITY): Payer: Managed Care, Other (non HMO)

## 2015-04-20 ENCOUNTER — Encounter (HOSPITAL_COMMUNITY): Payer: Self-pay | Admitting: *Deleted

## 2015-04-20 ENCOUNTER — Inpatient Hospital Stay (HOSPITAL_COMMUNITY): Payer: Managed Care, Other (non HMO) | Admitting: Certified Registered"

## 2015-04-20 ENCOUNTER — Inpatient Hospital Stay (HOSPITAL_COMMUNITY): Payer: Managed Care, Other (non HMO) | Admitting: Emergency Medicine

## 2015-04-20 DIAGNOSIS — Z9889 Other specified postprocedural states: Secondary | ICD-10-CM

## 2015-04-20 DIAGNOSIS — M5117 Intervertebral disc disorders with radiculopathy, lumbosacral region: Principal | ICD-10-CM | POA: Diagnosis present

## 2015-04-20 DIAGNOSIS — I10 Essential (primary) hypertension: Secondary | ICD-10-CM | POA: Diagnosis present

## 2015-04-20 DIAGNOSIS — M5136 Other intervertebral disc degeneration, lumbar region: Secondary | ICD-10-CM | POA: Diagnosis not present

## 2015-04-20 DIAGNOSIS — F419 Anxiety disorder, unspecified: Secondary | ICD-10-CM | POA: Diagnosis present

## 2015-04-20 DIAGNOSIS — F329 Major depressive disorder, single episode, unspecified: Secondary | ICD-10-CM | POA: Diagnosis present

## 2015-04-20 DIAGNOSIS — Z87891 Personal history of nicotine dependence: Secondary | ICD-10-CM

## 2015-04-20 DIAGNOSIS — M5489 Other dorsalgia: Secondary | ICD-10-CM | POA: Diagnosis present

## 2015-04-20 DIAGNOSIS — Z79899 Other long term (current) drug therapy: Secondary | ICD-10-CM | POA: Diagnosis not present

## 2015-04-20 DIAGNOSIS — Z419 Encounter for procedure for purposes other than remedying health state, unspecified: Secondary | ICD-10-CM

## 2015-04-20 DIAGNOSIS — M549 Dorsalgia, unspecified: Secondary | ICD-10-CM | POA: Diagnosis present

## 2015-04-20 HISTORY — PX: ANTERIOR LUMBAR FUSION: SHX1170

## 2015-04-20 HISTORY — DX: Personal history of other infectious and parasitic diseases: Z86.19

## 2015-04-20 HISTORY — PX: ABDOMINAL EXPOSURE: SHX5708

## 2015-04-20 LAB — PREGNANCY, URINE: PREG TEST UR: NEGATIVE

## 2015-04-20 SURGERY — ANTERIOR LUMBAR FUSION 1 LEVEL
Anesthesia: General

## 2015-04-20 MED ORDER — MENTHOL 3 MG MT LOZG: 1.0000 | LOZENGE | OROMUCOSAL | Status: DC | PRN

## 2015-04-20 MED ORDER — ONDANSETRON HCL 4 MG/2ML IJ SOLN: 4.0000 mg | Freq: Four times a day (QID) | INTRAMUSCULAR | Status: DC | PRN

## 2015-04-20 MED ORDER — OXYCODONE HCL 5 MG PO TABS
ORAL_TABLET | ORAL | Status: AC
  Administered 2015-04-20: 5 mg
  Filled 2015-04-20: qty 2

## 2015-04-20 MED ORDER — CEFAZOLIN SODIUM-DEXTROSE 2-3 GM-% IV SOLR
2.0000 g | INTRAVENOUS | Status: AC
  Administered 2015-04-20: 2 g via INTRAVENOUS
  Filled 2015-04-20: qty 50

## 2015-04-20 MED ORDER — NEOSTIGMINE METHYLSULFATE 10 MG/10ML IV SOLN
INTRAVENOUS | Status: DC | PRN
  Administered 2015-04-20: 3 mg via INTRAVENOUS

## 2015-04-20 MED ORDER — GLYCOPYRROLATE 0.2 MG/ML IJ SOLN
INTRAMUSCULAR | Status: DC | PRN
  Administered 2015-04-20: 0.4 mg via INTRAVENOUS

## 2015-04-20 MED ORDER — SUFENTANIL CITRATE 50 MCG/ML IV SOLN
INTRAVENOUS | Status: AC
  Filled 2015-04-20: qty 1

## 2015-04-20 MED ORDER — OXYCODONE HCL 5 MG PO TABS
ORAL_TABLET | ORAL | Status: AC
  Filled 2015-04-20: qty 1

## 2015-04-20 MED ORDER — OXYCODONE HCL 5 MG PO TABS
10.0000 mg | ORAL_TABLET | ORAL | Status: DC | PRN
  Administered 2015-04-20 – 2015-04-22 (×9): 10 mg via ORAL
  Filled 2015-04-20 (×10): qty 2

## 2015-04-20 MED ORDER — SODIUM CHLORIDE 0.9 % IV SOLN
250.0000 mL | INTRAVENOUS | Status: DC
Start: 2015-04-20 — End: 2015-04-22

## 2015-04-20 MED ORDER — PROPOFOL 10 MG/ML IV BOLUS
INTRAVENOUS | Status: AC
  Filled 2015-04-20: qty 20

## 2015-04-20 MED ORDER — METHOCARBAMOL 500 MG PO TABS
500.0000 mg | ORAL_TABLET | Freq: Four times a day (QID) | ORAL | Status: DC | PRN
Start: 2015-04-20 — End: 2015-04-22
  Administered 2015-04-20 – 2015-04-22 (×5): 500 mg via ORAL
  Filled 2015-04-20 (×4): qty 1

## 2015-04-20 MED ORDER — LACTATED RINGERS IV SOLN
INTRAVENOUS | Status: DC | PRN
  Administered 2015-04-20 (×2): via INTRAVENOUS

## 2015-04-20 MED ORDER — SODIUM CHLORIDE 0.9 % IJ SOLN: 3.0000 mL | INTRAMUSCULAR | Status: DC | PRN

## 2015-04-20 MED ORDER — METHOCARBAMOL 500 MG PO TABS: 500.0000 mg | ORAL_TABLET | Freq: Three times a day (TID) | ORAL | Status: DC | PRN

## 2015-04-20 MED ORDER — NEOSTIGMINE METHYLSULFATE 10 MG/10ML IV SOLN
INTRAVENOUS | Status: AC
  Filled 2015-04-20: qty 1

## 2015-04-20 MED ORDER — OXYCODONE-ACETAMINOPHEN 10-325 MG PO TABS: 1.0000 | ORAL_TABLET | ORAL | Status: DC | PRN

## 2015-04-20 MED ORDER — ACETAMINOPHEN 10 MG/ML IV SOLN
INTRAVENOUS | Status: DC | PRN
  Administered 2015-04-20: 1000 mg via INTRAVENOUS

## 2015-04-20 MED ORDER — METHOCARBAMOL 500 MG PO TABS
ORAL_TABLET | ORAL | Status: AC
  Filled 2015-04-20: qty 1

## 2015-04-20 MED ORDER — HYDROMORPHONE HCL 1 MG/ML IJ SOLN
0.2500 mg | INTRAMUSCULAR | Status: DC | PRN
  Administered 2015-04-20 (×4): 0.5 mg via INTRAVENOUS

## 2015-04-20 MED ORDER — SUCCINYLCHOLINE CHLORIDE 20 MG/ML IJ SOLN
INTRAMUSCULAR | Status: AC
  Filled 2015-04-20: qty 1

## 2015-04-20 MED ORDER — THROMBIN 5000 UNITS EX SOLR
CUTANEOUS | Status: DC | PRN
  Administered 2015-04-20: 5000 [IU] via TOPICAL

## 2015-04-20 MED ORDER — HYDROMORPHONE HCL 1 MG/ML IJ SOLN
0.5000 mg | INTRAMUSCULAR | Status: DC | PRN
  Administered 2015-04-20 – 2015-04-22 (×7): 1 mg via INTRAVENOUS
  Filled 2015-04-20 (×7): qty 1

## 2015-04-20 MED ORDER — ONDANSETRON HCL 4 MG/2ML IJ SOLN
4.0000 mg | INTRAMUSCULAR | Status: DC | PRN
  Filled 2015-04-20: qty 2

## 2015-04-20 MED ORDER — HYDROMORPHONE HCL 1 MG/ML IJ SOLN
INTRAMUSCULAR | Status: AC
  Filled 2015-04-20: qty 1

## 2015-04-20 MED ORDER — INFLUENZA VAC SPLIT QUAD 0.5 ML IM SUSY
0.5000 mL | PREFILLED_SYRINGE | INTRAMUSCULAR | Status: AC
  Administered 2015-04-21: 0.5 mL via INTRAMUSCULAR
  Filled 2015-04-20: qty 0.5

## 2015-04-20 MED ORDER — PHENOL 1.4 % MT LIQD: 1.0000 | OROMUCOSAL | Status: DC | PRN

## 2015-04-20 MED ORDER — 0.9 % SODIUM CHLORIDE (POUR BTL) OPTIME
TOPICAL | Status: DC | PRN
  Administered 2015-04-20 (×3): 1000 mL

## 2015-04-20 MED ORDER — CHLORHEXIDINE GLUCONATE 4 % EX LIQD: 60.0000 mL | Freq: Once | CUTANEOUS | Status: DC

## 2015-04-20 MED ORDER — ONDANSETRON HCL 4 MG/2ML IJ SOLN
INTRAMUSCULAR | Status: AC
  Filled 2015-04-20: qty 2

## 2015-04-20 MED ORDER — FLEET ENEMA 7-19 GM/118ML RE ENEM
1.0000 | ENEMA | Freq: Once | RECTAL | Status: DC
  Filled 2015-04-20: qty 1

## 2015-04-20 MED ORDER — GLYCOPYRROLATE 0.2 MG/ML IJ SOLN
INTRAMUSCULAR | Status: AC
  Filled 2015-04-20: qty 2

## 2015-04-20 MED ORDER — HEMOSTATIC AGENTS (NO CHARGE) OPTIME
TOPICAL | Status: DC | PRN
  Administered 2015-04-20: 1 via TOPICAL

## 2015-04-20 MED ORDER — LISINOPRIL 10 MG PO TABS
10.0000 mg | ORAL_TABLET | Freq: Every day | ORAL | Status: DC
  Administered 2015-04-21 – 2015-04-22 (×2): 10 mg via ORAL
  Filled 2015-04-20 (×2): qty 1

## 2015-04-20 MED ORDER — LACTATED RINGERS IV SOLN: INTRAVENOUS | Status: DC

## 2015-04-20 MED ORDER — PROPOFOL 10 MG/ML IV BOLUS
INTRAVENOUS | Status: DC | PRN
  Administered 2015-04-20: 20 mg via INTRAVENOUS
  Administered 2015-04-20: 150 mg via INTRAVENOUS

## 2015-04-20 MED ORDER — VECURONIUM BROMIDE 10 MG IV SOLR
INTRAVENOUS | Status: AC
  Filled 2015-04-20: qty 10

## 2015-04-20 MED ORDER — MAGNESIUM CITRATE PO SOLN
0.5000 | Freq: Once | ORAL | Status: AC
  Administered 2015-04-21: 0.5 via ORAL
  Filled 2015-04-20: qty 296

## 2015-04-20 MED ORDER — DEXAMETHASONE SODIUM PHOSPHATE 4 MG/ML IJ SOLN
4.0000 mg | Freq: Four times a day (QID) | INTRAMUSCULAR | Status: AC
  Administered 2015-04-21 (×2): 4 mg via INTRAVENOUS
  Filled 2015-04-20 (×2): qty 1

## 2015-04-20 MED ORDER — DEXTROSE 5 % IV SOLN
500.0000 mg | Freq: Four times a day (QID) | INTRAVENOUS | Status: DC | PRN
  Filled 2015-04-20: qty 5

## 2015-04-20 MED ORDER — DEXAMETHASONE 4 MG PO TABS
4.0000 mg | ORAL_TABLET | Freq: Four times a day (QID) | ORAL | Status: AC
  Administered 2015-04-20: 4 mg via ORAL
  Filled 2015-04-20: qty 1

## 2015-04-20 MED ORDER — MIDAZOLAM HCL 5 MG/5ML IJ SOLN
INTRAMUSCULAR | Status: DC | PRN
  Administered 2015-04-20: 2 mg via INTRAVENOUS

## 2015-04-20 MED ORDER — LIDOCAINE HCL (CARDIAC) 20 MG/ML IV SOLN
INTRAVENOUS | Status: DC | PRN
  Administered 2015-04-20: 55 mg via INTRAVENOUS

## 2015-04-20 MED ORDER — THROMBIN 5000 UNITS EX SOLR
CUTANEOUS | Status: AC
  Filled 2015-04-20: qty 5000

## 2015-04-20 MED ORDER — STERILE WATER FOR INJECTION IJ SOLN
INTRAMUSCULAR | Status: AC
  Filled 2015-04-20: qty 10

## 2015-04-20 MED ORDER — OXYCODONE HCL 5 MG PO TABS
5.0000 mg | ORAL_TABLET | Freq: Once | ORAL | Status: AC | PRN
  Administered 2015-04-20: 5 mg via ORAL

## 2015-04-20 MED ORDER — SODIUM CHLORIDE 0.9 % IJ SOLN
3.0000 mL | Freq: Two times a day (BID) | INTRAMUSCULAR | Status: DC
Start: 2015-04-20 — End: 2015-04-22
  Administered 2015-04-20 – 2015-04-21 (×2): 3 mL via INTRAVENOUS

## 2015-04-20 MED ORDER — VECURONIUM BROMIDE 10 MG IV SOLR
INTRAVENOUS | Status: DC | PRN
  Administered 2015-04-20: 2 mg via INTRAVENOUS
  Administered 2015-04-20 (×5): 1 mg via INTRAVENOUS
  Administered 2015-04-20: 7 mg via INTRAVENOUS
  Administered 2015-04-20: 1 mg via INTRAVENOUS

## 2015-04-20 MED ORDER — ONDANSETRON HCL 4 MG/2ML IJ SOLN
INTRAMUSCULAR | Status: DC | PRN
  Administered 2015-04-20: 4 mg via INTRAVENOUS

## 2015-04-20 MED ORDER — SUFENTANIL CITRATE 50 MCG/ML IV SOLN
INTRAVENOUS | Status: DC | PRN
  Administered 2015-04-20 (×5): 10 ug via INTRAVENOUS
  Administered 2015-04-20: 20 ug via INTRAVENOUS

## 2015-04-20 MED ORDER — OXYCODONE HCL 5 MG/5ML PO SOLN: 5.0000 mg | Freq: Once | ORAL | Status: AC | PRN

## 2015-04-20 MED ORDER — ONDANSETRON HCL 4 MG PO TABS: 4.0000 mg | ORAL_TABLET | Freq: Three times a day (TID) | ORAL | Status: DC | PRN

## 2015-04-20 MED ORDER — MIDAZOLAM HCL 2 MG/2ML IJ SOLN
INTRAMUSCULAR | Status: AC
  Filled 2015-04-20: qty 2

## 2015-04-20 MED ORDER — ENOXAPARIN SODIUM 30 MG/0.3ML ~~LOC~~ SOLN: 30.0000 mg | Freq: Two times a day (BID) | SUBCUTANEOUS | Status: DC

## 2015-04-20 MED ORDER — ENOXAPARIN SODIUM 40 MG/0.4ML ~~LOC~~ SOLN
40.0000 mg | SUBCUTANEOUS | Status: DC
  Administered 2015-04-21 – 2015-04-22 (×2): 40 mg via SUBCUTANEOUS
  Filled 2015-04-20 (×2): qty 0.4

## 2015-04-20 MED ORDER — CEFAZOLIN SODIUM 1-5 GM-% IV SOLN
1.0000 g | Freq: Three times a day (TID) | INTRAVENOUS | Status: AC
  Administered 2015-04-20 – 2015-04-21 (×2): 1 g via INTRAVENOUS
  Filled 2015-04-20 (×2): qty 50

## 2015-04-20 MED ORDER — DEXAMETHASONE SODIUM PHOSPHATE 10 MG/ML IJ SOLN
INTRAMUSCULAR | Status: DC | PRN
  Administered 2015-04-20: 10 mg via INTRAVENOUS

## 2015-04-20 MED ORDER — PNEUMOCOCCAL VAC POLYVALENT 25 MCG/0.5ML IJ INJ
0.5000 mL | INJECTION | INTRAMUSCULAR | Status: AC
  Administered 2015-04-21: 0.5 mL via INTRAMUSCULAR
  Filled 2015-04-20: qty 0.5

## 2015-04-20 MED ORDER — DEXAMETHASONE SODIUM PHOSPHATE 10 MG/ML IJ SOLN
INTRAMUSCULAR | Status: AC
  Filled 2015-04-20: qty 1

## 2015-04-20 MED ORDER — ACETAMINOPHEN 10 MG/ML IV SOLN
INTRAVENOUS | Status: AC
  Filled 2015-04-20: qty 100

## 2015-04-20 MED ORDER — SODIUM CHLORIDE 0.9 % IJ SOLN
INTRAMUSCULAR | Status: AC
  Filled 2015-04-20: qty 10

## 2015-04-20 MED ORDER — ALUM & MAG HYDROXIDE-SIMETH 200-200-20 MG/5ML PO SUSP
30.0000 mL | Freq: Four times a day (QID) | ORAL | Status: DC | PRN
  Administered 2015-04-20: 30 mL via ORAL
  Filled 2015-04-20: qty 30

## 2015-04-20 MED ORDER — LIDOCAINE HCL (CARDIAC) 20 MG/ML IV SOLN
INTRAVENOUS | Status: AC
  Filled 2015-04-20: qty 5

## 2015-04-20 SURGICAL SUPPLY — 96 items
ADH SKN CLS APL DERMABOND .7 (GAUZE/BANDAGES/DRESSINGS) ×1
APPLIER CLIP 11 MED OPEN (CLIP) ×6
APR CLP MED 11 20 MLT OPN (CLIP) ×2
BLADE SURG 10 STRL SS (BLADE) ×3 IMPLANT
BLADE SURG ROTATE 9660 (MISCELLANEOUS) IMPLANT
BONE MATRIX VIVIGEN 5CC (Bone Implant) ×3 IMPLANT
CLIP APPLIE 11 MED OPEN (CLIP) ×2 IMPLANT
CLOSURE WOUND 1/2 X4 (GAUZE/BANDAGES/DRESSINGS) ×1
CORDS BIPOLAR (ELECTRODE) ×3 IMPLANT
COVER SURGICAL LIGHT HANDLE (MISCELLANEOUS) ×3 IMPLANT
COVER TABLE BACK 60X90 (DRAPES) ×3 IMPLANT
DERMABOND ADVANCED (GAUZE/BANDAGES/DRESSINGS) ×2
DERMABOND ADVANCED .7 DNX12 (GAUZE/BANDAGES/DRESSINGS) ×1 IMPLANT
DRAPE C-ARM 42X72 X-RAY (DRAPES) ×6 IMPLANT
DRAPE INCISE IOBAN 66X45 STRL (DRAPES) ×3 IMPLANT
DRAPE POUCH INSTRU U-SHP 10X18 (DRAPES) ×3 IMPLANT
DRAPE SURG 17X23 STRL (DRAPES) ×3 IMPLANT
DRAPE U-SHAPE 47X51 STRL (DRAPES) ×3 IMPLANT
DRSG MEPILEX BORDER 4X8 (GAUZE/BANDAGES/DRESSINGS) ×3 IMPLANT
DURAPREP 26ML APPLICATOR (WOUND CARE) ×3 IMPLANT
ELECT BLADE 4.0 EZ CLEAN MEGAD (MISCELLANEOUS) ×3
ELECT CAUTERY BLADE 6.4 (BLADE) ×3 IMPLANT
ELECT PENCIL ROCKER SW 15FT (MISCELLANEOUS) ×3 IMPLANT
ELECT REM PT RETURN 9FT ADLT (ELECTROSURGICAL) ×3
ELECTRODE BLDE 4.0 EZ CLN MEGD (MISCELLANEOUS) ×1 IMPLANT
ELECTRODE REM PT RTRN 9FT ADLT (ELECTROSURGICAL) ×1 IMPLANT
GAUZE SPONGE 4X4 16PLY XRAY LF (GAUZE/BANDAGES/DRESSINGS) IMPLANT
GLOVE BIO SURGEON STRL SZ7.5 (GLOVE) ×2 IMPLANT
GLOVE BIOGEL PI IND STRL 8 (GLOVE) ×2 IMPLANT
GLOVE BIOGEL PI IND STRL 8.5 (GLOVE) ×2 IMPLANT
GLOVE BIOGEL PI INDICATOR 8 (GLOVE) ×4
GLOVE BIOGEL PI INDICATOR 8.5 (GLOVE) ×4
GLOVE ECLIPSE 7.5 STRL STRAW (GLOVE) ×3 IMPLANT
GLOVE ORTHO TXT STRL SZ7.5 (GLOVE) ×3 IMPLANT
GLOVE SS BIOGEL STRL SZ 8.5 (GLOVE) ×2 IMPLANT
GLOVE SUPERSENSE BIOGEL SZ 8.5 (GLOVE) ×4
GLOVE SURG SS PI 6.5 STRL IVOR (GLOVE) ×4 IMPLANT
GOWN STRL REUS W/ TWL LRG LVL3 (GOWN DISPOSABLE) ×4 IMPLANT
GOWN STRL REUS W/TWL 2XL LVL3 (GOWN DISPOSABLE) ×9 IMPLANT
GOWN STRL REUS W/TWL LRG LVL3 (GOWN DISPOSABLE) ×12
GRAFT BNE MATRIX VG 5 (Bone Implant) IMPLANT
HEMOSTAT SNOW SURGICEL 2X4 (HEMOSTASIS) IMPLANT
INSERT FOGARTY 61MM (MISCELLANEOUS) IMPLANT
INSERT FOGARTY SM (MISCELLANEOUS) IMPLANT
INTERPLATE 39X14X8 (Plate) ×3 IMPLANT
KIT BASIN OR (CUSTOM PROCEDURE TRAY) ×3 IMPLANT
KIT ROOM TURNOVER OR (KITS) ×6 IMPLANT
LIQUID BAND (GAUZE/BANDAGES/DRESSINGS) ×3 IMPLANT
LOOP VESSEL MAXI BLUE (MISCELLANEOUS) IMPLANT
LOOP VESSEL MINI RED (MISCELLANEOUS) IMPLANT
NDL SPNL 18GX3.5 QUINCKE PK (NEEDLE) ×1 IMPLANT
NEEDLE SPNL 18GX3.5 QUINCKE PK (NEEDLE) ×3 IMPLANT
NS IRRIG 1000ML POUR BTL (IV SOLUTION) ×5 IMPLANT
PACK LAMINECTOMY ORTHO (CUSTOM PROCEDURE TRAY) ×3 IMPLANT
PACK UNIVERSAL I (CUSTOM PROCEDURE TRAY) ×3 IMPLANT
PAD ARMBOARD 7.5X6 YLW CONV (MISCELLANEOUS) ×12 IMPLANT
PEEK SPACER INTERPLAT 35X14X8 (Peek) ×2 IMPLANT
PLATE SPINAL INTERPLAT 39X14X8 (Plate) IMPLANT
PUTTY BONE DBX 5CC MIX (Putty) ×2 IMPLANT
SCREW BONE RESCUE (Screw) ×8 IMPLANT
SCREW SPINAL STD (Orthopedic Implant) ×2 IMPLANT
SPONGE INTESTINAL PEANUT (DISPOSABLE) ×26 IMPLANT
SPONGE LAP 18X18 X RAY DECT (DISPOSABLE) IMPLANT
SPONGE LAP 4X18 X RAY DECT (DISPOSABLE) IMPLANT
SPONGE SURGIFOAM ABS GEL 100 (HEMOSTASIS) IMPLANT
STAPLER VISISTAT (STAPLE) IMPLANT
STRIP CLOSURE SKIN 1/2X4 (GAUZE/BANDAGES/DRESSINGS) ×1 IMPLANT
SURGIFLO W/THROMBIN 8M KIT (HEMOSTASIS) ×2 IMPLANT
SUT BONE WAX W31G (SUTURE) ×3 IMPLANT
SUT MON AB 3-0 SH 27 (SUTURE) ×3
SUT MON AB 3-0 SH27 (SUTURE) ×1 IMPLANT
SUT PDS AB 1 CTX 36 (SUTURE) ×3 IMPLANT
SUT PROLENE 4 0 RB 1 (SUTURE)
SUT PROLENE 4-0 RB1 .5 CRCL 36 (SUTURE) IMPLANT
SUT PROLENE 5 0 C 1 24 (SUTURE) IMPLANT
SUT PROLENE 5 0 CC1 (SUTURE) IMPLANT
SUT PROLENE 6 0 C 1 30 (SUTURE) ×3 IMPLANT
SUT PROLENE 6 0 CC (SUTURE) IMPLANT
SUT SILK 0 TIES 10X30 (SUTURE) ×3 IMPLANT
SUT SILK 2 0 TIES 10X30 (SUTURE) ×6 IMPLANT
SUT SILK 2 0SH CR/8 30 (SUTURE) IMPLANT
SUT SILK 3 0 TIES 10X30 (SUTURE) ×6 IMPLANT
SUT SILK 3 0SH CR/8 30 (SUTURE) IMPLANT
SUT VIC AB 0 CT1 27 (SUTURE) ×6
SUT VIC AB 0 CT1 27XBRD ANBCTR (SUTURE) ×2 IMPLANT
SUT VIC AB 1 CT1 27 (SUTURE) ×6
SUT VIC AB 1 CT1 27XBRD ANBCTR (SUTURE) ×2 IMPLANT
SUT VIC AB 2-0 CT1 18 (SUTURE) ×3 IMPLANT
SUT VIC AB 2-0 CTB1 (SUTURE) ×3 IMPLANT
SUT VIC AB 3-0 SH 27 (SUTURE) ×3
SUT VIC AB 3-0 SH 27X BRD (SUTURE) ×1 IMPLANT
SYR BULB IRRIGATION 50ML (SYRINGE) ×3 IMPLANT
TOWEL OR 17X24 6PK STRL BLUE (TOWEL DISPOSABLE) ×3 IMPLANT
TOWEL OR 17X26 10 PK STRL BLUE (TOWEL DISPOSABLE) ×3 IMPLANT
TRAY FOLEY CATH 16FR SILVER (SET/KITS/TRAYS/PACK) ×3 IMPLANT
WATER STERILE IRR 1000ML POUR (IV SOLUTION) ×3 IMPLANT

## 2015-04-20 NOTE — Brief Op Note (Signed)
04/20/2015  10:32 AM  PATIENT:  Jocelyn [name]  49 y.o. female  PRE-OPERATIVE DIAGNOSIS:  DDD L5-S1 WITH STENOSIS  POST-OPERATIVE DIAGNOSIS:  DDD L5-S1 WITH STENOSIS  PROCEDURE:  Procedure(s): ANTERIOR LUMBAR FUSION Lumbar five-sacral one (N/A) ABDOMINAL EXPOSURE (N/A)  SURGEON:  Surgeon(s) and Role: Panel 1:    * Venita Lickahari Helton Oleson, MD - Primary  Panel 2:    * Chuck Hinthristopher S Dickson, MD - Primary  PHYSICIAN ASSISTANT:   ASSISTANTS: Carmen Mayo   ANESTHESIA:   general  EBL:  Total I/O In: 1000 [I.V.:1000] Out: 325 [Urine:225; Blood:100]  BLOOD ADMINISTERED:none  DRAINS: none   LOCAL MEDICATIONS USED:  MARCAINE     SPECIMEN:  No Specimen  DISPOSITION OF SPECIMEN:  N/A  COUNTS:  YES  TOURNIQUET:  * No tourniquets in log *  DICTATION: .Other Dictation: Dictation Number [id number]  PLAN OF CARE: Admit to inpatient   PATIENT DISPOSITION:  PACU - hemodynamically stable.

## 2015-04-20 NOTE — Interval H&P Note (Signed)
History and Physical Interval Note:  04/20/2015 7:18 AM  Jocelyn [name]  has presented today for surgery, with the diagnosis of DDD L5-S1 WITH STENOSIS  The various methods of treatment have been discussed with the patient and family. After consideration of risks, benefits and other options for treatment, the patient has consented to  Procedure(s): ANTERIOR LUMBAR FUSION L5-S1 (N/A) ABDOMINAL EXPOSURE (N/A) as a surgical intervention .  The patient's history has been reviewed, patient examined, no change in status, stable for surgery.  I have reviewed the patient's chart and labs.  Questions were answered to the patient's satisfaction.     Waverly Ferrariickson, Christopher

## 2015-04-20 NOTE — OR Nursing (Signed)
Dr. Karle StarchLukens, Radiologist reported that only instrumentation at L-5 noted. No other foreign bodies seen. Dr. Shon BatonBrooks notified.

## 2015-04-20 NOTE — Op Note (Signed)
    NAME: Jocelyn [name] [place]   MRN: [id number] DOB: 01/20/1966    DATE OF OPERATION: 04/20/2015  PREOP DIAGNOSIS: Degenerative disc disease L5-S1  POSTOP DIAGNOSIS: Same  PROCEDURE: Anterior retroperitoneal exposure of L5-S1  EXPOSURE SURGEON: Di Kindlehristopher S. Edilia Boickson, MD  SPINE SURGEON: Venita Lickahari Brooks, MD  ASSIST: Anette Riedelarmen Mayo, PA  ANESTHESIA: Gen.   EBL: minimal  INDICATIONS: Jocelyn [name] Suman is [name] [age] y.o. female with significant degenerative disc disease L5-S1. I was asked to provide anterior retroperitoneal exposure.  FINDINGS: significant degenerative disc disease L5-S1  TECHNIQUE: The patient was taken to the operating room and received [name] general anesthetic. The level of the L5-S1 disc was marked under fluoroscopy. The abdomen was prepped and draped in the usual sterile fashion. An incision was made at the proposed level just to the left of the midline. This was [name] transverse incision. Dissection was carried down to the anterior rectus sheath which was divided transversely past the level of the linea alba exposing [name] short segment of the right rectus abdominis muscle. Laterally the anterior rectus sheath was divided out to the linea semilunaris. The rectus abdominis muscle was mobilized circumferentially. Initially the muscle was retracted medially and the retroperitoneal space was entered. The dissection was carried down to the sacrum and then up to the L5 S1 disc space. I did divide the middle sacral vessels between clips. I was ultimately able to place the [facility] retractor using the reverse lip blade initially on the right side and then on the left side allowing adequate exposure of the L5-S1 disc. The correct position was confirmed under fluoroscopy. The remainder of the dictation is as per Dr. Shon BatonBrooks.  Waverly Ferrarihristopher Odie Rauen, MD, FACS [facility]  DATE OF DICTATION:   04/20/2015

## 2015-04-20 NOTE — H&P (Signed)
History of Present Illness  The patient is a 49 year old female who comes in today for a preoperative History and Physical. The patient is scheduled for a ALIF L5-S1 to be performed by Dr. Debria Garretahari D. Shon BatonBrooks, MD at [facility] on 04/20/15 . Please see the hospital record for complete dictated history and physical.  Additional reasons for visit:  Follow-up back is described as the following: The patient is being followed for their right-sided back pain. They are now 2 week(s) out from from L5-S1 ESI injection Dr. Ethelene Halamos. Symptoms reported today include: pain. The patient states that they are doing poorly (shot didn't help). The following medication has been used for pain control: Percocet (has been taking 1 every 3-4 hrs). The patient reports their current pain level to be moderate to severe. The patient presents today following ESI. The pt denied benefit. the pt has gone to PT but says it is expensive. The pt reports right radicular pain. She does not think she can go back to her job. She says she has to lift 50-90 lbs at a time and the job is very repetitive.  Allergies Tetra *TETRACYCLINES*  Difficulty swallowing, Difficulty breathing.  Family History Depression  Mother, Sister. child Drug / Alcohol Addiction  Brother, Father. First Degree Relatives  reported Hypertension  Father. Osteoarthritis  Maternal Grandmother.  Social History  Children  4 Current work status  disabled Exercise  Exercises daily; does running / walking Former drinker  01/10/2015: In the past drank wine only occasionally per week Living situation  live with partner Marital status  divorced No history of drug/alcohol rehab  Number of flights of stairs before winded  less than 1 Tobacco / smoke exposure  01/10/2015: yes Tobacco use  Current every day smoker. 01/10/2015: smoke(d) less than 1/2 pack(s) per day  Medication History  Hydrocodone-Acetaminophen (7.5-325MG  Tablet, Oral)  Active. Meloxicam (15MG  Tablet, Oral) Active. Lisinopril (10MG  Tablet, Oral) Active. Methocarbamol (750MG  Tablet, Oral) Active. ALPRAZolam (0.5MG  Tablet, Oral) Active. Medications Reconciled  Pregnancy / Birth History Pregnant  no  Past Surgical History  Gallbladder Surgery  laporoscopic Inguinal Hernia Repair  laparoscopic: bilateral  Other Problems  Anxiety Disorder  Depression  High blood pressure  Osteoarthritis   Review of Systems  General Present- Weight Gain. Not Present- Appetite Loss, Chills, Fatigue, Feeling sick, Fever, Night Sweats and Weight Loss. Skin Not Present- Change in Hair or Nails, Itching, Psoriasis, Rash, Skin Color Changes and Ulcer. HEENT Not Present- Hearing problems, Nose Bleed, Ringing in the Ears and Sensitivity to light. Neck Not Present- Neck Mass and Swollen Glands. Respiratory Not Present- Bloody sputum, Chronic Cough, Dyspnea and Snoring. Cardiovascular Present- Swelling of Extremities. Not Present- Chest Pain, Leg Cramps, Palpitations and Shortness of Breath. Gastrointestinal Not Present- Abdominal Pain, Bloody Stool, Heartburn, Incontinence of Stool, Nausea and Vomiting. Female Genitourinary Present- Frequency and Menstrual Irregularities. Not Present- Blood in Urine, Incontinence and Nocturia. Musculoskeletal Present- Back Pain. Not Present- Joint Pain, Joint Stiffness, Joint Swelling, Muscle Pain and Muscle Weakness. Neurological Present- Burning, Headaches, Numbness and Tingling. Not Present- Dizziness and Tremor. Psychiatric Present- Anxiety and Depression. Not Present- Memory Loss. Endocrine Present- Cold Intolerance. Not Present- Excessive hunger, Excessive Thirst and Heat Intolerance. Hematology Not Present- Abnormal Bleeding, Anemia, Blood Clots and Easy Bruising.  Vitals  04/14/2015 9:43 AM Weight: 180 lb Height: 64in Body Surface Area: 1.87 m Body Mass Index: 30.9 kg/m  Temp.: 98.6F(Oral)  Pulse: 93  (Regular)  BP: 125/85 (Sitting, Left Arm, Standard)  Objective Transcription  Today on exam, she is a pleasant woman who appears younger than her stated age. She is alert. She is oriented x3. No shortness of breath or chest pain. The abdomen is soft and nontender. No incontinence of bowel and bladder. She has 5/5 strength in the lower extremity. Dysesthesias and horrific back pain. Dysesthesias going to both legs, right side greater than the left. Pain with forward flexion and extension. Compartments are soft and nontender. Intact peripheral pulses throughout.   RADIOGRAPHS MRI and x-rays confirmed significant degenerative disc disease at L5-S1 with posterolateral to the right disc protrusion, disc herniation causing nerve irritation.   At this point time, she did indicate that she was diagnosed with an umbilical hernia, but she is seeing Dr. Edilia Bo and he does not feel as though there are any problems with proceeding with the approach. We have again gone over the risks of an ALIF, which include infection, bleeding, nerve damage, death, stroke, paralysis, failure to heal, need for further surgery, ongoing or worse pain, and loss of bowel and bladder control. If it does not fuse or if material gets pushed back and there is increasing neuropathic pain, we may have to do a posterior decompression and possible supplemental fusion. All of her and her husband's questions were addressed. We will be planning on moving forward with surgery next week. I did give her Percocet to get to that visit to the surgical date.  Assessment & Plan   Anterior lumbar fusion: Risks of surgery include, but are not limited to: Death, stroke, paralysis, nerve root damage/injury, bleeding, blood clots, loss of bowel/bladder control, sexual dysfunction, retrograde ejaculation, hardware failure, or mal-position, spinal fluid leak, adjacent segment disease, non-union, need for further surgery, ongoing or worse pain, injury to  bladder, bowel and abdominal contents, infection. Need for supplemental posterior surgery including decompression and need for screw fixation.  Goal Of Surgery: Discussed that goal of surgery is to reduce pain and improve function and quality of life. Patient is aware that despite all appropriate treatment that there pain and function could be the same, worse, or different.

## 2015-04-20 NOTE — Progress Notes (Addendum)
Bruises still remains to lower back, pt states that the area is getting better. Purple in color.

## 2015-04-20 NOTE — Anesthesia Procedure Notes (Signed)
Procedure Name: Intubation Date/Time: 04/20/2015 7:37 AM Performed by: Charm BargesBUTLER, Marria Mathison R Pre-anesthesia Checklist: Patient identified, Emergency Drugs available, Suction available, Patient being monitored and Timeout performed Patient Re-evaluated:Patient Re-evaluated prior to inductionOxygen Delivery Method: Circle system utilized Preoxygenation: Pre-oxygenation with 100% oxygen Intubation Type: IV induction Ventilation: Mask ventilation without difficulty Laryngoscope Size: Mac and 3 Grade View: Grade II Tube type: Oral Tube size: 7.5 mm Number of attempts: 1 Airway Equipment and Method: Stylet Placement Confirmation: ETT inserted through vocal cords under direct vision and positive ETCO2 Secured at: 21 cm Tube secured with: Tape Dental Injury: Teeth and Oropharynx as per pre-operative assessment

## 2015-04-20 NOTE — Transfer of Care (Signed)
Immediate Anesthesia Transfer of Care Note  Patient: Jocelyn [name]  Procedure(s) Performed: Procedure(s): ANTERIOR LUMBAR FUSION Lumbar five-sacral one (N/A) ABDOMINAL EXPOSURE (N/A)  Patient Location: PACU  Anesthesia Type:General  Level of Consciousness: alert , oriented and patient cooperative  Airway & Oxygen Therapy: Patient Spontanous Breathing and Patient connected to nasal cannula oxygen  Post-op Assessment: Report given to RN, Post -op Vital signs reviewed and stable and Patient moving all extremities  Post vital signs: Reviewed and stable  Last Vitals:  Filed Vitals:   04/20/15 0548  BP: 146/89  Pulse: 88  Temp: 36.6 C  Resp: 18    Complications: No apparent anesthesia complications

## 2015-04-20 NOTE — Anesthesia Preprocedure Evaluation (Signed)
Anesthesia Evaluation  Patient identified by MRN, date of birth, ID band Patient awake    Reviewed: Allergy & Precautions, NPO status , Patient's Chart, lab work & pertinent test results  Airway Mallampati: II   Neck ROM: full    Dental   Pulmonary former smoker,    breath sounds clear to auscultation       Cardiovascular hypertension,  Rhythm:regular Rate:Normal     Neuro/Psych Anxiety Depression    GI/Hepatic   Endo/Other  obese  Renal/GU      Musculoskeletal  (+) Arthritis ,   Abdominal   Peds  Hematology   Anesthesia Other Findings   Reproductive/Obstetrics                             Anesthesia Physical Anesthesia Plan  ASA: II  Anesthesia Plan: General   Post-op Pain Management:    Induction: Intravenous  Airway Management Planned: Oral ETT  Additional Equipment:   Intra-op Plan:   Post-operative Plan: Extubation in OR  Informed Consent: I have reviewed the patients History and Physical, chart, labs and discussed the procedure including the risks, benefits and alternatives for the proposed anesthesia with the patient or authorized representative who has indicated his/her understanding and acceptance.     Plan Discussed with: CRNA, Anesthesiologist and Surgeon  Anesthesia Plan Comments:         Anesthesia Quick Evaluation

## 2015-04-20 NOTE — Progress Notes (Signed)
   VASCULAR SURGERY POSTOP NOTE:  *  Doing well  SUBJECTIVE: Pain well controlled  PHYSICAL EXAM: Filed Vitals:   04/20/15 1230 04/20/15 1300 04/20/15 1330 04/20/15 1400  BP: 128/82 120/78 130/84 124/88  Pulse: 94 89 99 90  Temp:      TempSrc:      Resp: 12 13 19 11   Height:      Weight:      SpO2: 98% 98% 99% 97%   Dressing dry Palpable left DP pulse.   Active Problems:   Back pain  Cari CarawayChris Minka Knight Beeper: [phone] 04/20/2015

## 2015-04-20 NOTE — Op Note (Signed)
Jocelyn [name]:  Jocelyn [name], Jocelyn [name]                ACCOUNT NO.:  [id number]  MEDICAL RECORD NO.:  [id number]  LOCATION:  5M13C                        FACILITY:  [facility]  PHYSICIAN:  Leilan Bochenek D. Shon BatonBrooks, M.D. DATE OF BIRTH:  03-01-1966  DATE OF PROCEDURE:  04/20/2015 DATE OF DISCHARGE:                              OPERATIVE REPORT   PREOPERATIVE DIAGNOSIS:  Degenerative disk disease, L5-S1 with radicular right leg pain.  POSTOPERATIVE DIAGNOSIS:  Degenerative disk disease, L5-S1 with radicular right leg pain.  OPERATIVE PROCEDURE:  Anterior lumbar interbody fusion, L5-S1.  Anterior interbody fixation with PEEK interbody cage, size 14, 8-degree lordotic, packed with ViviGen and DBX mix.  Anterior lumbar plating, RSV inner- plate 0 profile with 325-mm length locking screws.  FIRST ASSISTANT:  [facility].  APPROACH SURGEON:  Di KindleChristopher S. Edilia Boickson, M.D.  HISTORY:  This is a very pleasant 49 year old woman who has been complaining of severe back, buttock and leg pain for sometime.  Attempts at conservative management had failed to alleviate her symptoms over the last 7-1/2 to 8 months.  As a result of the failure of conservative care, we elected to proceed with surgery.  All appropriate risks, benefits, and alternatives were discussed with the patient and consent was obtained.  OPERATIVE NOTE:  The patient was brought to the operating room and placed supine on the operating table.  After successful induction of general anesthesia and endotracheal intubation, TEDs, SCDs, and a Foley were inserted.  She was placed supine onto the operating room table and the abdomen was prepped and draped in a standard fashion.  Dr. Edilia Boickson then scrubbed in and performed a standard retroperitoneal anterior approach to the lumbar spine.  Once the retractors were in position, I then scrubbed in.  Time-out was taken again to confirm the L5-S1 level was the appropriate level.  Once I confirmed that he had exposed the  correct level, an annulotomy was performed with a 10-blade scalpel and then using a combination of pituitary rongeurs, curettes and Kerrison rongeurs, I removed all of the disk material at L5-S1.  I distracted the intervertebral space and then released the annulus posteriorly using a curved curette.  I released it from L5 and S1.  I then removed a hard osteophyte posterolateral on the right side.  At this point, I then also rasped the endplates to ensure I had bleeding subchondral bone.  I then placed trial size 12 8-degree lordotic, the size 14.  The 14 gave the best fit.  I also checked with under live fluoro to ensure I had parallel distraction of the endplates. Once I had this complete, I then obtained the PEEK interbody cage and malleted it to the appropriate depth.  The anterior plate was then affixed on top of this.  I then placed three locking screws; two into L5 and one into S1.  I got good fixation and excellent purchase.  I then placed the locking cap to prevent backout and was torqued off according to manufacturer's standards.  The wound was then copiously irrigated with normal saline and I removed each of the retractor sequentially to ensure there was no bleeding. Once this was done, I did irrigate copiously the abdomen and  then closed the fascia of the rectus with running #1 Prolene suture.  I then closed in a layered fashion with 0 Vicryl suture runner, 2-0 interrupted Vicryl sutures and a 3-0 Monocryl.  Steri-Strips and dry dressing were applied and the patient was ultimately extubated, transferred to the PACU without incident.  At the end of the case, all needle and sponge counts were correct.     Wilfred Siverson D. Shon Baton, M.D.     DDB/MEDQ  D:  04/20/2015  T:  04/20/2015  Job:  [id number]  cc:   Di Kindle. Edilia Bo, M.D.

## 2015-04-20 NOTE — Anesthesia Postprocedure Evaluation (Signed)
Anesthesia Post Note  Patient: Jocelyn BogaDee A [name]  Procedure(s) Performed: Procedure(s) (LRB): ANTERIOR LUMBAR FUSION Lumbar five-sacral one (N/A) ABDOMINAL EXPOSURE (N/A)  Patient location during evaluation: PACU Anesthesia Type: General Level of consciousness: awake and alert and patient cooperative Pain management: pain level controlled Vital Signs Assessment: post-procedure vital signs reviewed and stable Respiratory status: spontaneous breathing and respiratory function stable Cardiovascular status: stable Anesthetic complications: no    Last Vitals:  Filed Vitals:   04/20/15 1200 04/20/15 1230  BP: 125/78 128/82  Pulse: 91 94  Temp:    Resp: 16 12    Last Pain:  Filed Vitals:   04/20/15 1245  PainSc: Asleep                 [name] S

## 2015-04-20 NOTE — H&P (View-Only) (Signed)
[facility] [place]  Patient name: Jocelyn [name] MRN: [id number] DOB: 12/20/1965 Sex: female  REASON FOR CONSULT: to evaluate for anterior retroperitoneal exposure of L5-S1. Referred by Dr. Shon BatonBrooks  HPI: Jocelyn [name] is a 49 y.o. female, who is referred for evaluation of her anterior retroperitoneal exposure L5-S1. She has had back pain for over a year now. This came on gradually and she does not remember any specific injury. She has tried physical therapy, injection therapy, and pain medication without significant relief. She is felt to be a good candidate for anterior lumbar interbody fusion at L5-S1.  She has had a previous umbilical hernia repair with mesh. [place] by Dr. Lovell SheehanJenkins. She's also had a previous cholecystectomy.  I [facility]. She has had a long history of back pain and has undergone previous injection therapy. The injections did not help significantly. She has had moderate to severe pain.  Past Medical History  Diagnosis Date  . Anxiety disorder   . Depression   . Hypertension   . Osteoarthritis     History reviewed. No pertinent family history.  SOCIAL HISTORY: Social History   Social History  . Marital Status: Divorced    Spouse Name: N/A  . Number of Children: N/A  . Years of Education: N/A   Occupational History  . Not on file.   Social History Main Topics  . Smoking status: Former Smoker -- 0.25 packs/day for 30 years    Types: Cigarettes    Quit date: 02/13/2015  . Smokeless tobacco: Never Used  . Alcohol Use: No  . Drug Use: No  . Sexual Activity: Yes    Birth Control/ Protection: None   Other Topics Concern  . Not on file   Social History Narrative    Allergies  Allergen Reactions  . Tetracyclines & Related Anaphylaxis  . Hydrocodone Rash    Current Outpatient Prescriptions  Medication Sig Dispense Refill  .  cyclobenzaprine (FLEXERIL) 10 MG tablet Take 10 mg by mouth 3 (three) times daily as needed for muscle spasms.    Marland Kitchen. ibuprofen (ADVIL,MOTRIN) 200 MG tablet Take 600-800 mg by mouth every 6 (six) hours as needed for moderate pain.    Marland Kitchen. lisinopril (PRINIVIL,ZESTRIL) 10 MG tablet Take 10 mg by mouth daily.    . meloxicam (MOBIC) 15 MG tablet Take 15 mg by mouth daily.    . methocarbamol (ROBAXIN) 750 MG tablet Take 750 mg by mouth.    . oxyCODONE-acetaminophen (PERCOCET) 7.5-325 MG per tablet Take 1-2 tablets by mouth every 4 (four) hours as needed. 50 tablet 0  . ALPRAZolam (XANAX) 0.5 MG tablet Take 0.5 mg by mouth.    . naproxen (NAPROSYN) 500 MG tablet Take 1 tablet (500 mg total) by mouth 2 (two) times daily with a meal. (Patient not taking: Reported on 04/12/2015) 30 tablet 0   No current facility-administered medications for this visit.    REVIEW OF SYSTEMS:  [X]  denotes positive finding, [ ]  denotes negative finding Cardiac  Comments:  Chest pain or chest pressure:    Shortness of breath upon exertion:    Short of breath when lying flat:    Irregular heart rhythm:        Vascular    Pain in calf, thigh, or hip brought on by ambulation:    Pain in feet at night that wakes you up from your sleep:  Blood clot in your veins:    Leg swelling:         Pulmonary    Oxygen at home:    Productive cough:     Wheezing:         Neurologic    Sudden weakness in arms or legs:     Sudden numbness in arms or legs:     Sudden onset of difficulty speaking or slurred speech:    Temporary loss of vision in one eye:     Problems with dizziness:         Gastrointestinal    Blood in stool:     Vomited blood:         Genitourinary    Burning when urinating:     Blood in urine:        Psychiatric    Major depression:         Hematologic    Bleeding problems:    Problems with blood clotting too easily:        Skin    Rashes or ulcers:        Constitutional    Fever or chills:        PHYSICAL EXAM: Filed Vitals:   04/12/15 1204  BP: 120/84  Pulse: 91  Temp: 98 F (36.7 C)  TempSrc: Oral  Resp: 18  Height:  (1.626 m)  Weight: 183 lb (83.008 kg)  SpO2: 96%    GENERAL: The patient is a well-nourished female, in no acute distress. The vital signs are documented above. CARDIAC: There is a regular rate and rhythm.  VASCULAR: I do not detect carotid bruits. She has palpable posterior tibial pulses. She has no significant lower extremity swelling. PULMONARY: There is good air exchange bilaterally without wheezing or rales. ABDOMEN: Soft and non-tender with normal pitched bowel sounds. She has a slight hernia above her umbilicus. She has an incision below her umbilicus where she had a previous umbilical hernia repair. MUSCULOSKELETAL: There are no major deformities or cyanosis. NEUROLOGIC: No focal weakness or paresthesias are detected. SKIN: There are no ulcers or rashes noted. PSYCHIATRIC: The patient has a normal affect.  DATA:  I have reviewed her CT scan. I do not see any contraindications to anterior retroperitoneal exposure at the L5-S1 level.  MEDICAL ISSUES:  DEGENERATIVE DISC DISEASE L5-S1: The patient appears to be a reasonable candidate for anterior retroperitoneal exposure of L5-S1. I think we will be able to stay below her mesh and her hernia. I have reviewed our role in exposure of the spine in order to allow anterior lumbar interbody fusion at the appropriate levels. We have discussed the potential complications of surgery, including but not limited to, arterial or venous injury, thrombosis, or bleeding. We have also discussed the potential risks of wound healing problems, the development of a hernia, nerve injury, leg swelling, or other unpredictable medical problems. All the patient's questions were answered and they are agreeable to proceed.  Waverly Ferrari [facility] [place] Beeper: [phone]

## 2015-04-21 ENCOUNTER — Encounter (HOSPITAL_COMMUNITY): Payer: Self-pay | Admitting: Orthopedic Surgery

## 2015-04-21 ENCOUNTER — Inpatient Hospital Stay (HOSPITAL_COMMUNITY): Payer: Managed Care, Other (non HMO)

## 2015-04-21 DIAGNOSIS — Z9889 Other specified postprocedural states: Secondary | ICD-10-CM

## 2015-04-21 NOTE — Evaluation (Signed)
Physical Therapy Evaluation & Discharge Patient Details Name: Jocelyn [name] MRN: [id number] DOB: 1966-01-20 Today's Date: 04/21/2015   History of Present Illness  49 y.o. female s/p L5-S1 anterior lumbar fusion, abdominal exposure. PMH significant for HTN, osteoarthritis, depression, and anxiety.  Clinical Impression  Patient presents at supervision to mod independent level with mobility.  Demonstrates good use of techniques to follow precautions and gave handout with pictures and written instructions.  No further PT needs at this time.  May need referral to outpatient PT when cleared by MD prior to return to work for core strength, posture and body mechanics education.    Follow Up Recommendations No PT follow up    Equipment Recommendations  Rolling walker with 5" wheels    Recommendations for Other Services       Precautions / Restrictions Precautions Precautions: Back Precaution Booklet Issued: Yes (comment) Precaution Comments: Pt unable to verbalize any back precautions despite just working with PT. Reviewed precautions and handout. Required Braces or Orthoses: Spinal Brace Spinal Brace: Lumbar corset;Applied in sitting position Restrictions Weight Bearing Restrictions: No      Mobility  Bed Mobility Overal bed mobility: Needs Assistance Bed Mobility: Rolling;Sidelying to Sit;Sit to Sidelying Rolling: Modified independent (Device/Increase time) Sidelying to sit: Modified independent (Device/Increase time)     Sit to sidelying: Modified independent (Device/Increase time) General bed mobility comments: used bed rail to pull over in rolling and sit up  Transfers Overall transfer level: Needs assistance Equipment used: None Transfers: Sit to/from Stand Sit to Stand: Modified independent (Device/Increase time)         General transfer comment: increased time  Ambulation/Gait Ambulation/Gait assistance: Supervision Ambulation Distance (Feet): 300  Feet Assistive device: Rolling walker (2 wheeled) Gait Pattern/deviations: Step-through pattern;Decreased stride length     General Gait Details: walker for support due to abdominal pain  Stairs Stairs: Yes Stairs assistance: Supervision Stair Management: One rail Right;Sideways;Step to pattern Number of Stairs: 4 General stair comments: able to negotiate steps after demonstration and cues  Wheelchair Mobility    Modified Rankin (Stroke Patients Only)       Balance Overall balance assessment: No apparent balance deficits (not formally assessed) Sitting-balance support: No upper extremity supported;Feet supported Sitting balance-Leahy Scale: Fair     Standing balance support: No upper extremity supported;During functional activity Standing balance-Leahy Scale: Fair                               Pertinent Vitals/Pain Pain Assessment: 0-10 Pain Score: 7  Pain Location: back Pain Descriptors / Indicators: Sore Pain Intervention(s): Monitored during session;Repositioned    Home Living Family/patient expects to be discharged to:: Private residence Living Arrangements: Spouse/significant other Available Help at Discharge: Friend(s);Available 24 hours/day (boyfriend) Type of Home: House Home Access: Stairs to enter Entrance Stairs-Rails: Left Entrance Stairs-Number of Steps: 3 Home Layout: One level Home Equipment: None Additional Comments: 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 [employer]    Prior Function Level of Independence: Needs assistance      ADL's / [employer] Assistance Needed: Boyfriend provided supervision/assistance for showering and IADLs due to increased pain        Hand Dominance   Dominant Hand: Right    Extremity/Trunk Assessment   Upper Extremity Assessment: Overall WFL for tasks assessed           Lower Extremity Assessment: RLE deficits/detail;LLE deficits/detail RLE Deficits / Details: limited hip flexion and weakness due  to abdominal incision and c/o feeling bloated needing to have BM, knee extension and ankle DF WFL LLE Deficits / Details: limited hip flexion and weakness due to abdominal incision and c/o feeling bloated needing to have BM, knee extension and ankle DF WFL  Cervical / Trunk Assessment: Normal  Communication   Communication: No difficulties  Cognition Arousal/Alertness: Awake/alert Behavior During Therapy: WFL for tasks assessed/performed Overall Cognitive Status: Within Functional Limits for tasks assessed       Memory: Decreased recall of precautions              General Comments General comments (skin integrity, edema, etc.): Also discussed transfers into truck with running board    Exercises        Assessment/Plan    PT Assessment Patent does not need any further PT services  PT Diagnosis Acute pain   PT Problem List    PT Treatment Interventions     PT Goals (Current goals can be found in the Care Plan section) Acute Rehab PT Goals Patient Stated Goal: to go home PT Goal Formulation: All assessment and education complete, DC therapy    Frequency     Barriers to discharge        Co-evaluation               End of Session Equipment Utilized During Treatment: Back brace Activity Tolerance: Patient tolerated treatment well Patient left: in bed;with call bell/phone within reach           Time: 1320-1345 PT Time Calculation (min) (ACUTE ONLY): 25 min   Charges:   PT Evaluation $Initial PT Evaluation Tier I: 1 Procedure PT Treatments $Gait Training: 8-22 mins   PT G CodesElray Mcgregor May 19, 2015, 2:56 PM  [name], PT [phone] May 19, 2015

## 2015-04-21 NOTE — Progress Notes (Signed)
   VASCULAR SURGERY ASSESSMENT & PLAN:  * 1 Day Post-Op s/p: Anterior retroperitoneal exposure of L5-S1  *  Doing well. Vascular will be available as needed.  SUBJECTIVE: pain well controlled.  PHYSICAL EXAM: Filed Vitals:   04/20/15 2138 04/21/15 0158 04/21/15 1010 04/21/15 1356  BP: 124/73 105/71 125/71 116/59  Pulse: 87 92 97 98  Temp: 97.8 F (36.6 C) 98.5 F (36.9 C) 98.2 F (36.8 C) 98.1 F (36.7 C)  TempSrc: Oral Oral Oral Oral  Resp: 18 18 16 18   Height:      Weight:      SpO2: 98% 93% 94% 94%   Palpable dorsalis pedis pulses.  LABS: Lab Results  Component Value Date   WBC 8.6 04/17/2015   HGB 13.8 04/17/2015   HCT 41.4 04/17/2015   MCV 96.5 04/17/2015   PLT 251 04/17/2015   Lab Results  Component Value Date   CREATININE 0.68 04/17/2015   Active Problems:   Back pain  Cari CarawayChris Kate Larock Beeper: [phone] 04/21/2015

## 2015-04-21 NOTE — Progress Notes (Signed)
OT Cancellation Note  Patient Details Name: Jocelyn [name] MRN: [id number] DOB: [birth year]   Cancelled Treatment:    Reason Eval/Treat Not Completed: Fatigue/lethargy limiting ability to participate. Pt finishing breakfast and very tired - requesting therapy come back after lunch. Will attempt to see later this afternoon if time allows.  Nils PyleJulia Jiana Lemaire, OTR/L 04/21/2015, 9:33 AM

## 2015-04-21 NOTE — Progress Notes (Signed)
    Subjective: Procedure(s) (LRB): ANTERIOR LUMBAR FUSION Lumbar five-sacral one (N/A) ABDOMINAL EXPOSURE (N/A) 1 Day Post-Op  Patient reports pain as 3 on 0-10 scale.  Reports decreased leg pain reports incisional back pain   Positive void Negative bowel movement Positive flatus Negative chest pain or shortness of breath  Objective: Vital signs in last 24 hours: Temp:  [97.5 F (36.4 C)-98.5 F (36.9 C)] 98.5 F (36.9 C) (12/16 0158) Pulse Rate:  [84-105] 92 (12/16 0158) Resp:  [11-22] 18 (12/16 0158) BP: (105-134)/(71-89) 105/71 mmHg (12/16 0158) SpO2:  [93 %-99 %] 93 % (12/16 0158)  Intake/Output from previous day: 12/15 0701 - 12/16 0700 In: 1500 [I.V.:1500] Out: 1225 [Urine:1125; Blood:100]  Labs: No results for input(s): WBC, RBC, HCT, PLT in the last 72 hours. No results for input(s): NA, K, CL, CO2, BUN, CREATININE, GLUCOSE, CALCIUM in the last 72 hours. No results for input(s): LABPT, INR in the last 72 hours.  Physical Exam: Neurologically intact Intact pulses distally Incision: dressing C/D/I Compartment soft  Assessment/Plan: Patient stable  xrays pending Continue mobilization with physical therapy Continue care  Advance diet Up with therapy Plan for discharge tomorrow if positive BM and cleared by PT  Venita Lickahari Cally Nygard, MD [facility] [phone]

## 2015-04-21 NOTE — Progress Notes (Signed)
Utilization review completed. Lillian Tigges, RN, BSN. 

## 2015-04-21 NOTE — Progress Notes (Signed)
VASCULAR LAB PRELIMINARY  PRELIMINARY  PRELIMINARY  PRELIMINARY  Bilateral lower extremity venous duplex completed.    Preliminary report:  Bilateral:  No evidence of DVT, superficial thrombosis, or Baker's Cyst.    Tami L Wood, RVT, RDMS 04/21/2015, 9:39 AM

## 2015-04-21 NOTE — Clinical Social Work Note (Signed)
Patient not requiring SNF placement at discharge. CSW signing off. Please re-consult if necessary.   Jocelyn [name] Jeannia Tatro MSW, EarthLCSW, [place], [phone]

## 2015-04-21 NOTE — Progress Notes (Signed)
  [facility] Progress Note  Subjective  - POD #1  Some soreness around abdominal incision. Denies nausea. Passing flatus.   Objective Filed Vitals:   04/20/15 2138 04/21/15 0158  BP: 124/73 105/71  Pulse: 87 92  Temp: 97.8 F (36.6 C) 98.5 F (36.9 C)  Resp: 18 18    Intake/Output Summary (Last 24 hours) at 04/21/15 0757 Last data filed at 04/20/15 1520  Gross per 24 hour  Intake   1500 ml  Output   1225 ml  Net    275 ml   Lower abdominal incision dressed.  Abdomen soft and non tender. Normoactive bowel sounds. Palpable dorsalis pedis pulses bilaterally   Assessment/Planning: 49 y.o. female is s/p: anterior retroperitoneal exposure for anterior lumber interbody fusion L5-S1 1 Day Post-Op   Stable from vascular standpoint  Passing flatus, abdomen soft. Palpable pedal pulses.  Will be available as needed.   Raymond GurneyKimberly A Rodrigus Kilker 04/21/2015 7:57 AM --  Laboratory CBC    Component Value Date/Time   WBC 8.6 04/17/2015 1025   HGB 13.8 04/17/2015 1025   HCT 41.4 04/17/2015 1025   PLT 251 04/17/2015 1025    BMET    Component Value Date/Time   NA 138 04/17/2015 1025   K 4.1 04/17/2015 1025   CL 104 04/17/2015 1025   CO2 25 04/17/2015 1025   GLUCOSE 104* 04/17/2015 1025   BUN 7 04/17/2015 1025   CREATININE 0.68 04/17/2015 1025   CALCIUM 8.8* 04/17/2015 1025   GFRNONAA >60 04/17/2015 1025   GFRAA >60 04/17/2015 1025    COAG No results found for: INR, PROTIME No results found for: PTT  Antibiotics Anti-infectives    Start     Dose/Rate Route Frequency Ordered Stop   04/20/15 1630  ceFAZolin (ANCEF) IVPB 1 g/50 mL premix     1 g 100 mL/hr over 30 Minutes Intravenous Every 8 hours 04/20/15 1556 04/21/15 0050   04/20/15 0607  ceFAZolin (ANCEF) IVPB 2 g/50 mL premix     2 g 100 mL/hr over 30 Minutes Intravenous 30 min pre-op 04/20/15 [id number] 04/20/15 0730       Maris BergerKimberly Bingham Millette, PA-C [facility] Office:  [phone] Pager: [phone] 04/21/2015 7:57 AM

## 2015-04-21 NOTE — Progress Notes (Signed)
Occupational Therapy Evaluation Patient Details Name: Jocelyn [name] MRN: [id number] DOB: [birth year] Today's Date: 04/21/2015    History of Present Illness 49 y.o. female s/p L5-S1 anterior lumbar fusion, abdominal exposure. PMH significant for HTN, osteoarthritis, depression, and anxiety.   Clinical Impression   PTA, pt required assistance with some ADLs due to increased back pain. Pt currently presents with acute back pain and required supervision level assist for ADLs and mobility. Reviewed back precautions and began education on AE, compensatory strategies, and fall prevention strategies. Pt will benefit from acute skilled OT to increase independence and safety with ADLs and mobility. No OT follow up, see below for DME recommendations.    Follow Up Recommendations  No OT follow up;Supervision - Intermittent    Equipment Recommendations  3 in 1 bedside comode;Other (comment) (2-wheeled)    Recommendations for Other Services       Precautions / Restrictions Precautions Precautions: Back Precaution Booklet Issued: No Precaution Comments: Pt unable to verbalize any back precautions despite just working with PT. Reviewed precautions and handout. Required Braces or Orthoses: Spinal Brace Spinal Brace: Lumbar corset;Applied in sitting position Restrictions Weight Bearing Restrictions: No      Mobility Bed Mobility Overal bed mobility: Needs Assistance Bed Mobility: Rolling;Sidelying to Sit;Sit to Sidelying Rolling: Modified independent (Device/Increase time) Sidelying to sit: Modified independent (Device/Increase time)     Sit to sidelying: Modified independent (Device/Increase time) General bed mobility comments: HOB flat, use of bedrails. No physical assist or verbal cues requried for log roll technique  Transfers Overall transfer level: Needs assistance Equipment used: None Transfers: Sit to/from Stand Sit to Stand: Supervision         General transfer comment:  Supervision for safety due to fatigue. Good hand placement on seated surfaces.    Balance Overall balance assessment: Needs assistance Sitting-balance support: No upper extremity supported;Feet supported Sitting balance-Leahy Scale: Fair     Standing balance support: No upper extremity supported;During functional activity Standing balance-Leahy Scale: Fair                              ADL Overall ADL's : Needs assistance/impaired     Grooming: Wash/dry hands;Supervision/safety;Standing           Upper Body Dressing : Supervision/safety;Sitting   Lower Body Dressing: Moderate assistance;Cueing for back precautions;Sit to/from stand   Toilet Transfer: Supervision/safety;Ambulation;Regular Toilet;Grab bars   Toileting- Clothing Manipulation and Hygiene: Supervision/safety;Sit to/from stand       Functional mobility during ADLs: Supervision/safety General ADL Comments: Pt mod I for bathroom transfers and ADL tasks - pt refused assistance in bathroom and preferred privacy. Pt demonstrating good adherence to back precautions, but was unable to verbalize back precautions. Pt unable to cross ankle-over-knee for LB ADLs, but stated that her boyfriend will probably assist her with these tasks. Discussed fall prevention strategies and brace wear protocol.     Vision Vision Assessment?: No apparent visual deficits   Perception     Praxis      Pertinent Vitals/Pain Pain Assessment: 0-10 Pain Score: 6  Pain Location: back Pain Descriptors / Indicators: Operative site guarding;Sore Pain Intervention(s): Limited activity within patient's tolerance;Monitored during session;Repositioned     Hand Dominance Right   Extremity/Trunk Assessment Upper Extremity Assessment Upper Extremity Assessment: Overall WFL for tasks assessed   Lower Extremity Assessment Lower Extremity Assessment: Defer to PT evaluation   Cervical / Trunk Assessment Cervical / Trunk Assessment:  Normal  Communication Communication Communication: No difficulties   Cognition Arousal/Alertness: Awake/alert Behavior During Therapy: Flat affect Overall Cognitive Status: Within Functional Limits for tasks assessed       Memory: Decreased recall of precautions             General Comments       Exercises       Shoulder Instructions      Home Living Family/patient expects to be discharged to:: Private residence Living Arrangements: Spouse/significant other Available Help at Discharge: Friend(s);Available 24 hours/day (Boyfriend)               Bathroom Shower/Tub: Chief Strategy OfficerTub/shower unit   Bathroom Toilet: Handicapped height     Home Equipment: None          Prior Functioning/Environment Level of Independence: Needs assistance    ADL's / Homemaking Assistance Needed: Boyfriend provided supervision/assistance for showering and IADLs due to increased pain        OT Diagnosis: Acute pain   OT Problem List: Decreased strength;Decreased range of motion;Decreased activity tolerance;Impaired balance (sitting and/or standing);Decreased coordination;Decreased knowledge of use of DME or AE;Decreased safety awareness;Decreased knowledge of precautions;Pain   OT Treatment/Interventions: Self-care/ADL training;Therapeutic exercise;DME and/or AE instruction;Therapeutic activities;Patient/family education;Balance training    OT Goals(Current goals can be found in the care plan section) Acute Rehab OT Goals Patient Stated Goal: to go home OT Goal Formulation: With patient Time For Goal Achievement: 05/05/15 Potential to Achieve Goals: Good ADL Goals Pt Will Perform Lower Body Bathing: with supervision;with adaptive equipment;sitting/lateral leans;sit to/from stand Pt Will Perform Lower Body Dressing: with supervision;with adaptive equipment;sitting/lateral leans;sit to/from stand Pt Will Perform Tub/Shower Transfer: Tub transfer;with supervision;ambulating;3 in 1;rolling  walker Additional ADL Goal #1: Pt will demonstrate understanding of 3/3 back precautions to increase safety with ADLs and mobility.  OT Frequency: Min 2X/week   Barriers to D/C:            Co-evaluation              End of Session Equipment Utilized During Treatment: Gait belt;Back brace Nurse Communication: Mobility status;Precautions  Activity Tolerance: Patient limited by fatigue Patient left: in bed;with call bell/phone within reach;with bed alarm set   Time: 1400-1419 OT Time Calculation (min): 19 min Charges:  OT General Charges $OT Visit: 1 Procedure OT Evaluation $Initial OT Evaluation Tier I: 1 Procedure OT Treatments $Self Care/Home Management : 8-22 mins G-Codes:    Nils PyleJulia Zacchary Pompei, OTR/L 04/21/2015, 2:44 PM

## 2015-04-22 NOTE — Progress Notes (Signed)
Subjective: 2 Days Post-Op Procedure(s) (LRB): ANTERIOR LUMBAR FUSION Lumbar five-sacral one (N/A) ABDOMINAL EXPOSURE (N/A) Patient reports pain as 2 on 0-10 scale.  Doing well. Will DC  Objective: Vital signs in last 24 hours: Temp:  [97.8 F (36.6 C)-98.4 F (36.9 C)] 97.8 F (36.6 C) (12/17 0914) Pulse Rate:  [81-109] 109 (12/17 0914) Resp:  [16-18] 18 (12/17 0914) BP: (105-125)/(50-71) 105/61 mmHg (12/17 0914) SpO2:  [93 %-98 %] 93 % (12/17 0914)  Intake/Output from previous day:   Intake/Output this shift:    No results for input(s): HGB in the last 72 hours. No results for input(s): WBC, RBC, HCT, PLT in the last 72 hours. No results for input(s): NA, K, CL, CO2, BUN, CREATININE, GLUCOSE, CALCIUM in the last 72 hours. No results for input(s): LABPT, INR in the last 72 hours.  Neurologically intact  Assessment/Plan: 2 Days Post-Op Procedure(s) (LRB): ANTERIOR LUMBAR FUSION Lumbar five-sacral one (N/A) ABDOMINAL EXPOSURE (N/A) Up with therapy Discharge home with home health  Jocelyn [name] A 04/22/2015, 9:42 AM

## 2015-04-22 NOTE — Progress Notes (Signed)
Patient ready for discharge to home; discharge instructions given and reviewed; rx's given; belongings returned; discharged out via wheelchair; accompanied home by friend.

## 2015-04-22 NOTE — Care Management Note (Signed)
Case Management Note  Patient Details  Name: Jocelyn [name] MRN: [id number] Date of Birth: [birth year]  Subjective/Objective:                  ANTERIOR LUMBAR FUSION Lumbar five-sacral one  Action/Plan: CM spoke with patient regarding discharge plan and need for RW and patient chose [facility] for DME. [facility] who said that he would deliver to the bedside. The patient said that she has no further DME needs. Patient said that she will have family at home to provide support and does not feel that any additional support or HH is needed. Patient said that she has no trouble affording medications. No further CM needs communicated and patient planning to be discharged home today once she receives her rolling walker.   Expected Discharge Date:  04/22/15               Expected Discharge Plan:  Home/Self Care  In-House Referral:     Discharge planning Services  CM Consult  Post Acute Care Choice:  Durable Medical Equipment Choice offered to:  Patient  DME Arranged:  Dan HumphreysWalker rolling DME Agency:  [facility]  [facility]     Status of Service:  Completed, signed off  Medicare Important Message Given:    Date Medicare IM Given:    Medicare IM give by:    Date Additional Medicare IM Given:    Additional Medicare Important Message give by:     If discussed at Long Length of Stay Meetings, dates discussed:    Additional Comments:  Darcel SmallingAnna C Ameera Tigue, RN 04/22/2015, 10:05 AM

## 2015-04-22 NOTE — Progress Notes (Signed)
OT Cancellation Note  Patient Details Name: Jocelyn [name] A Lalli MRN: [id number] DOB: 1966-03-22   Cancelled Treatment:    Reason Eval/Treat Not Completed: Other (comment).  Attempted skilled OT and reviewed acute goals with pt.  Pt. 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  Asked if there were any questions or concerns. Pt. States no and that she had already been "signed off" by PT and just wanted to know when the MD was coming by this am because she is ready for d/c home.  Will alert OTR/L to sign off per pt. Request.  Robet LeuMorris, Alera Quevedo Lorraine, COTA/L 04/22/2015, 7:25 AM

## 2015-04-25 ENCOUNTER — Encounter (HOSPITAL_COMMUNITY): Payer: Self-pay | Admitting: Orthopedic Surgery

## 2015-04-26 NOTE — Discharge Summary (Signed)
Physician Discharge Summary  Patient ID: Jocelyn [name] MRN: [id number] DOB/AGE: [birth year] 49 y.o.  Admit date: 04/20/2015 Discharge date: 04/26/2015  Admission Diagnoses:  DDD with radicular leg pain  Discharge Diagnoses:  Active Problems:   Back pain   Past Medical History  Diagnosis Date  . Anxiety disorder   . Hypertension   . Osteoarthritis   . History of shingles     x 3     Surgeries: Procedure(s): ANTERIOR LUMBAR FUSION Lumbar five-sacral one ABDOMINAL EXPOSURE on 04/20/2015   Consultants (if any): Treatment Team:  Chuck Hint, MD  Discharged Condition: Improved  Hospital Course: Jocelyn [name] is an 49 y.o. female who was admitted 04/20/2015 with a diagnosis of DDD and radicular lower extremity pain and went to the operating room on 04/20/2015 and underwent the above named procedures.  Discharged on 04/22/15.  Hospital course is uneventful.  She was given perioperative antibiotics:  Anti-infectives    Start     Dose/Rate Route Frequency Ordered Stop   04/20/15 1630  ceFAZolin (ANCEF) IVPB 1 g/50 mL premix     1 g 100 mL/hr over 30 Minutes Intravenous Every 8 hours 04/20/15 1556 04/21/15 0050   04/20/15 0607  ceFAZolin (ANCEF) IVPB 2 g/50 mL premix     2 g 100 mL/hr over 30 Minutes Intravenous 30 min pre-op 04/20/15 0607 04/20/15 0730    .  She was given sequential compression devices, early ambulation, and TED for DVT prophylaxis.  She benefited maximally from the hospital stay and there were no complications.    Recent vital signs:  Filed Vitals:   04/22/15 0612 04/22/15 0914  BP: 122/60 105/61  Pulse: 81 109  Temp: 98.4 F (36.9 C) 97.8 F (36.6 C)  Resp: 18 18    Recent laboratory studies:  Lab Results  Component Value Date   HGB 13.8 04/17/2015   HGB 13.7 04/21/2014   HGB 15.6* 03/23/2014   Lab Results  Component Value Date   WBC 8.6 04/17/2015   PLT 251 04/17/2015   No results found for: INR Lab Results  Component  Value Date   NA 138 04/17/2015   K 4.1 04/17/2015   CL 104 04/17/2015   CO2 25 04/17/2015   BUN 7 04/17/2015   CREATININE 0.68 04/17/2015   GLUCOSE 104* 04/17/2015    Discharge Medications:     Medication List    STOP taking these medications        acetaminophen 500 MG tablet  Commonly known as:  TYLENOL     cyclobenzaprine 10 MG tablet  Commonly known as:  FLEXERIL     ibuprofen 200 MG tablet  Commonly known as:  ADVIL,MOTRIN     meloxicam 15 MG tablet  Commonly known as:  MOBIC     naproxen 500 MG tablet  Commonly known as:  NAPROSYN     oxyCODONE-acetaminophen 5-325 MG tablet  Commonly known as:  PERCOCET/ROXICET     oxyCODONE-acetaminophen 7.5-325 MG tablet  Commonly known as:  PERCOCET  Replaced by:  oxyCODONE-acetaminophen 10-325 MG tablet      TAKE these medications        enoxaparin 30 MG/0.3ML injection  Commonly known as:  LOVENOX  Inject 0.3 mLs (30 mg total) into the skin every 12 (twelve) hours. 10 day supply     lisinopril 10 MG tablet  Commonly known as:  PRINIVIL,ZESTRIL  Take 10 mg by mouth daily.     methocarbamol 500 MG tablet  Commonly known  as:  ROBAXIN  Take 1 tablet (500 mg total) by mouth 3 (three) times daily as needed for muscle spasms.     ondansetron 4 MG tablet  Commonly known as:  ZOFRAN  Take 1 tablet (4 mg total) by mouth every 8 (eight) hours as needed for nausea or vomiting.     oxyCODONE-acetaminophen 10-325 MG tablet  Commonly known as:  PERCOCET  Take 1 tablet by mouth every 4 (four) hours as needed for pain.        Diagnostic Studies: Dg Lumbar Spine 2-3 Views  04/21/2015  CLINICAL DATA:  49 year old female with recent history of lumbar spinal fusion presenting with low back pain this morning. EXAM: LUMBAR SPINE - 2-3 VIEW COMPARISON:  Lumbar spine radiograph 04/15/2015, and additional priors. FINDINGS: Three views of the lumbar spine demonstrate a compression fracture of T12 with approximately 15-20% loss of  anterior vertebral body height, which is similar to the prior examination. There are new postoperative changes at L5-S1, with an anterior plate and screw fixation device as well as an interbody implant. Very mild levoscoliosis of the lumbar spine convex to the left at the level of L4, similar to the preoperative film. Alignment is otherwise anatomic. Mild multilevel degenerative disc disease and facet arthropathy. Surgical clips project over the right upper quadrant of the abdomen, compatible with prior cholecystectomy. IMPRESSION: 1. New postoperative changes of PLIF at L5-S1, without complicating features. 2. Old compression fracture of T12 is unchanged. Electronically Signed   By: Trudie Reedaniel  Entrikin M.D.   On: 04/21/2015 08:20   Dg Lumbar Spine 2-3 Views  04/20/2015  CLINICAL DATA:  Status post ALIF at L5-S1 EXAM: LUMBAR SPINE - 2-3 VIEW; DG C-ARM 61-120 MIN COMPARISON:  Lumbar spine series of April 15, 2015 FINDINGS: Two fluoro spot images are reviewed. Fluoro time reported is 46 seconds. The patient has undergone an anterior fusion with inter discal device placement at L5-S1. Radiographic positioning of the prosthetic components is good. The L5 vertebral body is preserved in height. IMPRESSION: The patient has undergone a ALIF at L5-S1 without evidence of immediate postprocedure complication. Electronically Signed   By: David  SwazilandJordan M.D.   On: 04/20/2015 10:42   Dg C-arm 61-120 Min  04/20/2015  CLINICAL DATA:  Status post ALIF at L5-S1 EXAM: LUMBAR SPINE - 2-3 VIEW; DG C-ARM 61-120 MIN COMPARISON:  Lumbar spine series of April 15, 2015 FINDINGS: Two fluoro spot images are reviewed. Fluoro time reported is 46 seconds. The patient has undergone an anterior fusion with inter discal device placement at L5-S1. Radiographic positioning of the prosthetic components is good. The L5 vertebral body is preserved in height. IMPRESSION: The patient has undergone a ALIF at L5-S1 without evidence of immediate  postprocedure complication. Electronically Signed   By: David  [place]azilandJordan M.D.   On: 04/20/2015 10:42   Dg Or Local Abdomen  04/20/2015  CLINICAL DATA:  Abnormal instrument count, L5-S1 ALIF EXAM: OR LOCAL ABDOMEN COMPARISON:  04/15/2015 FINDINGS: Postsurgical changes are noted at L5-S1. No radiopaque instrument is seen. Air is noted in the surgical bed extending towards the pelvis. Radiopaque density is noted over the upper abdomen likely related to extrinsic artifact. IMPRESSION: No definitive radiopaque foreign body is noted. These results were called by telephone at the time of interpretation on 04/20/2015 at 10:31 am to the operating room. Electronically Signed   By: Alcide CleverMark  Lukens M.D.   On: 04/20/2015 10:31    Disposition: 01-Home or Self Care  Follow-up Information    Follow up with Alvy Beal, MD. Schedule an appointment as soon as possible for a visit in 2 weeks.   Specialty:  Orthopedic Surgery   Why:  If symptoms worsen, For suture removal, For wound re-check   Contact information:   [address] [place] [address] [phone]        Signed: Kirt Boys 04/26/2015, 7:10 PM

## 2015-07-19 ENCOUNTER — Encounter (HOSPITAL_COMMUNITY)
Admission: RE | Admit: 2015-07-19 | Discharge: 2015-07-19 | Disposition: A | Discharge status: Home / Self Care | Payer: BLUE CROSS/BLUE SHIELD | Source: Ambulatory Visit | Attending: General Surgery | Admitting: General Surgery

## 2015-07-19 ENCOUNTER — Encounter (HOSPITAL_COMMUNITY): Payer: Self-pay

## 2015-07-19 ENCOUNTER — Ambulatory Visit: Payer: Self-pay | Admitting: General Surgery

## 2015-07-19 DIAGNOSIS — Z01812 Encounter for preprocedural laboratory examination: Secondary | ICD-10-CM | POA: Insufficient documentation

## 2015-07-19 DIAGNOSIS — K432 Incisional hernia without obstruction or gangrene: Secondary | ICD-10-CM | POA: Insufficient documentation

## 2015-07-19 HISTORY — DX: Incisional hernia without obstruction or gangrene: K43.2

## 2015-07-19 HISTORY — DX: Age-related osteoporosis without current pathological fracture: M81.0

## 2015-07-19 LAB — BASIC METABOLIC PANEL
Anion gap: 9 (ref 5–15)
BUN: 10 mg/dL (ref 6–20)
CHLORIDE: 105 mmol/L (ref 101–111)
CO2: 24 mmol/L (ref 22–32)
CREATININE: 0.85 mg/dL (ref 0.44–1.00)
Calcium: 9.2 mg/dL (ref 8.9–10.3)
GFR calc Af Amer: >60 mL/min (ref >60)
GFR calc non Af Amer: >60 mL/min (ref >60)
GLUCOSE: 104 mg/dL — AB (ref 65–99)
POTASSIUM: 4.1 mmol/L (ref 3.5–5.1)
Sodium: 138 mmol/L (ref 135–145)

## 2015-07-19 LAB — CBC
HCT: 48.5 % — ABNORMAL HIGH (ref 36.0–46.0)
HEMOGLOBIN: 16.1 g/dL — AB (ref 12.0–15.0)
MCH: 31.1 pg (ref 26.0–34.0)
MCHC: 33.2 g/dL (ref 30.0–36.0)
MCV: 93.8 fL (ref 78.0–100.0)
PLATELETS: 332 10*3/uL (ref 150–400)
RBC: 5.17 MIL/uL — AB (ref 3.87–5.11)
RDW: 13.1 % (ref 11.5–15.5)
WBC: 9.3 10*3/uL (ref 4.0–10.5)

## 2015-07-19 LAB — HEPATIC FUNCTION PANEL
ALT: 11 U/L — ABNORMAL LOW (ref 14–54)
AST: 16 U/L (ref 15–41)
Albumin: 4.4 g/dL (ref 3.5–5.0)
Alkaline Phosphatase: 93 U/L (ref 38–126)
Bilirubin, Direct: <0.1 mg/dL — ABNORMAL LOW (ref 0.1–0.5)
Total Bilirubin: 0.3 mg/dL (ref 0.3–1.2)
Total Protein: 8.3 g/dL — ABNORMAL HIGH (ref 6.5–8.1)

## 2015-07-19 LAB — DIFFERENTIAL
Basophils Absolute: 0.1 10*3/uL (ref 0.0–0.1)
Basophils Relative: 1 %
Eosinophils Absolute: 0.2 10*3/uL (ref 0.0–0.7)
Eosinophils Relative: 3 %
Lymphocytes Relative: 21 %
Lymphs Abs: 1.9 10*3/uL (ref 0.7–4.0)
Monocytes Absolute: 0.5 10*3/uL (ref 0.1–1.0)
Monocytes Relative: 5 %
Neutro Abs: 6.2 10*3/uL (ref 1.7–7.7)
Neutrophils Relative %: 70 %

## 2015-07-19 NOTE — Patient Instructions (Addendum)
YOUR PROCEDURE IS SCHEDULED ON :  07/24/15  REPORT TO [facility] MAIN ENTRANCE FOLLOW SIGNS TO EAST ELEVATOR - GO TO 3rd FLOOR CHECK IN AT 3 EAST NURSES STATION (SHORT STAY) AT: 10:45 AM  CALL THIS NUMBER IF YOU HAVE PROBLEMS THE MORNING OF SURGERY [phone]  REMEMBER:ONLY 1 PER PERSON MAY GO TO SHORT STAY WITH YOU TO GET READY THE MORNING OF YOUR SURGERY  DO NOT EAT FOOD OR DRINK LIQUIDS AFTER MIDNIGHT  TAKE THESE MEDICINES THE MORNING OF SURGERY: MAY TAKE TYLENOL IF NEEDED  YOU MAY NOT HAVE ANY METAL ON YOUR BODY INCLUDING HAIR PINS AND PIERCING'S. DO NOT WEAR JEWELRY, MAKEUP, LOTIONS, POWDERS OR PERFUMES. DO NOT WEAR NAIL POLISH. DO NOT SHAVE 48 HRS PRIOR TO SURGERY. MEN MAY SHAVE FACE AND NECK.  DO NOT BRING VALUABLES TO HOSPITAL. [name] IS NOT RESPONSIBLE FOR VALUABLES.  CONTACTS, DENTURES OR PARTIALS MAY NOT BE WORN TO SURGERY. LEAVE SUITCASE IN CAR. CAN BE BROUGHT TO ROOM AFTER SURGERY.  PATIENTS DISCHARGED THE DAY OF SURGERY WILL NOT BE ALLOWED TO DRIVE HOME.  PLEASE READ OVER THE FOLLOWING INSTRUCTION SHEETS _________________________________________________________________________________                                          [name] - PREPARING FOR SURGERY  Before surgery, you can play an important role.  Because skin is not sterile, your skin needs to be as free of germs as possible.  You can reduce the number of germs on your skin by washing with CHG (chlorahexidine gluconate) soap before surgery.  CHG is an antiseptic cleaner which kills germs and bonds with the skin to continue killing germs even after washing. Please DO NOT use if you have an allergy to CHG or antibacterial soaps.  If your skin becomes reddened/irritated stop using the CHG and inform your nurse when you arrive at Short Stay. Do not shave (including legs and underarms) for at least 48 hours prior to the first CHG shower.  You may shave your face. Please follow these  instructions carefully:   1.  Shower with CHG Soap the night before surgery and the  morning of Surgery.   2.  If you choose to wash your hair, wash your hair first as usual with your  normal  Shampoo.   3.  After you shampoo, rinse your hair and body thoroughly to remove the  shampoo.                                         4.  Use CHG as you would any other liquid soap.  You can apply chg directly  to the skin and wash . Gently wash with scrungie or clean wascloth    5.  Apply the CHG Soap to your body ONLY FROM THE NECK DOWN.   Do not use on open                           Wound or open sores. Avoid contact with eyes, ears mouth and genitals (private parts).                        Genitals (private parts) with your normal  soap.              6.  Wash thoroughly, paying special attention to the area where your surgery  will be performed.   7.  Thoroughly rinse your body with warm water from the neck down.   8.  DO NOT shower/wash with your normal soap after using and rinsing off  the CHG Soap .                9.  Pat yourself dry with a clean towel.             10.  Wear clean night clothes to bed after shower             11.  Place clean sheets on your bed the night of your first shower and do not  sleep with pets.  Day of Surgery : Do not apply any lotions/deodorants the morning of surgery.  Please wear clean clothes to the hospital/surgery center.  FAILURE TO FOLLOW THESE INSTRUCTIONS MAY RESULT IN THE CANCELLATION OF YOUR SURGERY    PATIENT SIGNATURE_________________________________  ______________________________________________________________________                    MAY HAVE CLEAR LIQUIDS UNTIL 6:45 AM  CLEAR LIQUID DIET  Foods Allowed                                                                     Foods Excluded  Coffee and tea, regular and decaf                             liquids that you cannot  Plain Jell-O in any flavor                                              see through such as: Fruit ices (not with fruit pulp)                                     milk, soups, orange juice  Iced Popsicles                                                     All solid food Carbonated beverages, regular and diet                                    Cranberry, grape and apple juices Sports drinks like Gatorade Lightly seasoned clear broth or consume(fat free) Sugar, honey syrup   _____________________________________________________________________

## 2015-07-19 NOTE — H&P (Signed)
Jocelyn [name] 06/21/2015 10:30 AM Location: [facility] Patient #: [id number] DOB: 1965-12-30 Single / Language: [place] / Race: White Female   History of Present Illness Jocelyn [name](Neville Pauls M. Maiya Kates MD; 06/22/2015 8:21 AM) Patient words: hernia.  The patient is a 50 year old female who presents with an incisional hernia. She is referred by Dr Shon BatonBrooks for evaluation of a recurrent hernia. She states that she had a laparoscopic cholecystectomy a few years ago. She had a recurrent hernia around her bellybutton that was repaired by Dr. Lovell SheehanJenkins with mesh up at [facility] in December 2015. He apparently used a 4.3 cm Bard mesh. She states that she had a recurrent hernia above her umbilicus. It hadn't been bothering her. However [facility] it became very tender and very uncomfortable prompting her to go to the emergency room. She states that it was reduced. She has had intermittent tenderness in the area. She also has apparently some chronic back issues and chose to undergo back surgery before getting the hernia evaluated. She had back surgery in mid December. She is still recovering. She states that she is no longer on narcotics. She is just on some muscle relaxant. She states that she quit smoking in October in preparation for her back surgery. She attests that she has not resumed smoking. She denies any chest pain, chest pressure, shortness of breath, dyspnea on exertion, orthopnea, nausea, vomiting, diarrhea. She does have some intermittent constipation. She occasionally has to take a laxative. She states the hernia has been bothering her lately. However it is always reducible.   Problem List/Past Medical Jocelyn [name](Zariana Strub Elson ClanM Anh Bigos, MD; 06/22/2015 8:27 AM) Sherald HessINCISIONAL HERNIA, WITHOUT OBSTRUCTION OR GANGRENE (K43.2) I reviewed his CT that she brought from her ER visit in the fall. The hernia was not that large on the CT at that point and only contained adipose tissue I believe  it has increased in size based on today's physical exam.  Other Problems Atilano Ina(Sherion Dooly M Ader Fritze, MD; 06/22/2015 8:27 AM) Arthritis Back Pain Inguinal Hernia  Past Surgical History Gilmer Mor(Sonya Bynum, CMA; 06/21/2015 10:56 AM) Gallbladder Surgery - Laparoscopic Spinal Surgery - Lower Back  Diagnostic Studies History Gilmer Mor(Sonya Bynum, CMA; 06/21/2015 10:56 AM) Colonoscopy never Mammogram within last year Pap Smear 1-5 years ago  Allergies Gilmer Mor(Sonya Bynum, CMA; 06/21/2015 10:57 AM) Tetracycline HCl *Tetracyclines** Hydrocodone-Acetaminophen *ANALGESICS - OPIOID*  Medication History (Sonya Bynum, CMA; 06/21/2015 10:58 AM) Lovenox (30MG /0.3ML Solution, Subcutaneous) Active. Lisinopril (10MG  Tablet, Oral) Active. Robaxin (500MG  Tablet, Oral) Active. Zofran (4MG  Tablet, Oral) Active. Percocet (10-325MG  Tablet, Oral) Active. Medications Reconciled  Social History Gilmer Mor(Sonya Bynum, CMA; 06/21/2015 10:56 AM) Alcohol use Occasional alcohol use. Caffeine use Carbonated beverages, Coffee. No drug use Tobacco use Former smoker.  Family History Gilmer Mor(Sonya Bynum, CMA; 06/21/2015 10:56 AM) Alcohol Abuse Brother, Father. Depression Daughter, Mother, Sister. Hypertension Father.  Pregnancy / Birth History Gilmer Mor(Sonya Bynum, CMA; 06/21/2015 10:56 AM) Age at menarche 16 years. Gravida 4 Irregular periods Maternal age 50-20 Para 4    Review of Systems Lamar Laundry(Sonya Bynum CMA; 06/21/2015 10:56 AM) General Not Present- Appetite Loss, Chills, Fatigue, Fever, Night Sweats, Weight Gain and Weight Loss. Skin Not Present- Change in Wart/Mole, Dryness, Hives, Jaundice, New Lesions, Non-Healing Wounds, Rash and Ulcer. HEENT Present- Wears glasses/contact lenses. Not Present- Earache, Hearing Loss, Hoarseness, Nose Bleed, Oral Ulcers, Ringing in the Ears, Seasonal Allergies, Sinus Pain, Sore Throat, Visual Disturbances and Yellow Eyes. Respiratory Not Present- Bloody sputum, Chronic Cough, Difficulty Breathing,  Snoring and Wheezing. Breast  Not Present- Breast Mass, Breast Pain, Nipple Discharge and Skin Changes. Cardiovascular Not Present- Chest Pain, Difficulty Breathing Lying Down, Leg Cramps, Palpitations, Rapid Heart Rate, Shortness of Breath and Swelling of Extremities. Gastrointestinal Present- Abdominal Pain, Bloating and Excessive gas. Not Present- Bloody Stool, Change in Bowel Habits, Chronic diarrhea, Constipation, Difficulty Swallowing, Gets full quickly at meals, Hemorrhoids, Indigestion, Nausea, Rectal Pain and Vomiting. Female Genitourinary Present- Frequency and Nocturia. Not Present- Painful Urination, Pelvic Pain and Urgency. Musculoskeletal Present- Back Pain and Joint Stiffness. Not Present- Joint Pain, Muscle Pain, Muscle Weakness and Swelling of Extremities. Neurological Not Present- Decreased Memory, Fainting, Headaches, Numbness, Seizures, Tingling, Tremor, Trouble walking and Weakness. Psychiatric Not Present- Anxiety, Bipolar, Change in Sleep Pattern, Depression, Fearful and Frequent crying. Endocrine Not Present- Cold Intolerance, Excessive Hunger, Hair Changes, Heat Intolerance, Hot flashes and New Diabetes. Hematology Not Present- Easy Bruising, Excessive bleeding, Gland problems, HIV and Persistent Infections.  Vitals (Sonya Bynum CMA; 06/21/2015 10:57 AM) 06/21/2015 10:56 AM Weight: 178 lb Height: 64in Body Surface Area: 1.86 m Body Mass Index: 30.55 kg/m  Temp.: 97.44F(Temporal)  Pulse: 81 (Regular)  BP: 128/76 (Sitting, Left Arm, Standard)       Physical Exam Jocelyn [name] M. Dhilan Brauer MD; 06/22/2015 8:22 AM) General Mental Status-Alert. General Appearance-Consistent with stated age. Hydration-Well hydrated. Voice-Normal. Note: overweight   Head and Neck Head-normocephalic, atraumatic with no lesions or palpable masses. Trachea-midline. Thyroid Gland Characteristics - normal size and consistency.  Eye Eyeball - Bilateral-Extraocular  movements intact. Sclera/Conjunctiva - Bilateral-No scleral icterus.  Chest and Lung Exam Chest and lung exam reveals -quiet, even and easy respiratory effort with no use of accessory muscles and on auscultation, normal breath sounds, no adventitious sounds and normal vocal resonance. Inspection Chest Wall - Normal. Back - normal.  Breast - Did not examine.  Cardiovascular Cardiovascular examination reveals -normal heart sounds, regular rate and rhythm with no murmurs and normal pedal pulses bilaterally.  Abdomen Inspection  Inspection of the abdomen reveals: Note: obvious supraumbilical bulge. skin ok. soft, mild TTP. reducible. defect feels about 3 x 2.5 cm. Skin - Scar - Note: old umbilical incision. Palpation/Percussion Palpation and Percussion of the abdomen reveal - Soft, Non Tender, No Rebound tenderness, No Rigidity (guarding) and No hepatosplenomegaly. Auscultation Auscultation of the abdomen reveals - Bowel sounds normal.  Peripheral Vascular Upper Extremity Palpation - Pulses bilaterally normal.  Neurologic Neurologic evaluation reveals -alert and oriented x 3 with no impairment of recent or remote memory. Mental Status-Normal.  Neuropsychiatric The patient's mood and affect are described as -normal. Judgment and Insight-insight is appropriate concerning matters relevant to self.  Musculoskeletal Normal Exam - Left-Upper Extremity Strength Normal and Lower Extremity Strength Normal. Normal Exam - Right-Upper Extremity Strength Normal and Lower Extremity Strength Normal.  Lymphatic Head & Neck  General Head & Neck Lymphatics: Bilateral - Description - Normal. Axillary - Did not examine. Femoral & Inguinal - Did not examine.    Assessment & Plan Jocelyn [name] M. Caysie Minnifield MD; 06/22/2015 8:27 AM) Sherald Hess HERNIA, WITHOUT OBSTRUCTION OR GANGRENE (K43.2) Story: I reviewed his CT that she brought from her ER visit in the fall. The hernia was not that  large on the CT at that point and only contained adipose tissue I believe it has increased in size based on today's physical exam. Impression: We discussed the etiology of ventral incisional hernias. We discussed the signs and symptoms of incarceration and strangulation. The patient was given educational material. I also drew diagrams.  We discussed nonoperative and operative management.  With respect to operative management, we discussed both open repair and laparoscopic repair. We discussed the pros and cons of each approach. I discussed the typical aftercare with each procedure and how each procedure differs. I recommended reapproximating the muscle to restore her abdominal wall anatomay given her young age.  The patient has elected to laparoscopic assisted incisional hernia repair with mesh.  We discussed the risk and benefits of surgery including but not limited to bleeding, infection, injury to surrounding structures, hernia recurrence, mesh complications, hematoma/seroma formation, need to convert to an open procedure, blood clot formation, urinary retention, post operative ileus, general anesthesia risk, long-term abdominal pain. We discussed that this procedure can be quite uncomfortable and difficult to recover from based on how the mesh is secured to the abdominal wall. We discussed the importance of avoiding heavy lifting and straining for a period of 6 weeks. I stressed the importance of reframing from smoking since it would definitely increase risk of infection and recurrence. She and her husband voiced understanding. Current Plans Pt Education - Pamphlet Given - Hernia Surgery: discussed with patient and provided information. You are being scheduled for surgery - Our schedulers will call you.  You should hear from our office's scheduling department within 5 working days about the location, date, and time of surgery. We try to make accommodations for patient's preferences in scheduling  surgery, but sometimes the OR schedule or the surgeon's schedule prevents [place] from making those accommodations.  If you have not heard from our office [phone]) in 5 working days, call the office and ask for your surgeon's nurse.  If you have other questions about your diagnosis, plan, or surgery, call the office and ask for your surgeon's nurse.  Pt Education - [facility] Pain Control (Gross) Pt Education - [facility] Hernia Post-Op HCI (Gross): discussed with patient and provided information.  Mary Sella. Andrey Campanile, MD, FACS General, Bariatric, & Minimally Invasive Surgery [facility], [place]

## 2015-07-24 ENCOUNTER — Ambulatory Visit (HOSPITAL_COMMUNITY): Payer: BLUE CROSS/BLUE SHIELD | Admitting: Anesthesiology

## 2015-07-24 ENCOUNTER — Ambulatory Visit (HOSPITAL_COMMUNITY)
Admission: RE | Admit: 2015-07-24 | Discharge: 2015-07-25 | Disposition: A | Discharge status: Home / Self Care | Payer: BLUE CROSS/BLUE SHIELD | Source: Ambulatory Visit | Attending: General Surgery | Admitting: General Surgery

## 2015-07-24 ENCOUNTER — Encounter (HOSPITAL_COMMUNITY)
Admission: RE | Disposition: A | Discharge status: Home / Self Care | Payer: Self-pay | Source: Ambulatory Visit | Attending: General Surgery

## 2015-07-24 ENCOUNTER — Encounter (HOSPITAL_COMMUNITY): Payer: Self-pay | Admitting: *Deleted

## 2015-07-24 DIAGNOSIS — K432 Incisional hernia without obstruction or gangrene: Secondary | ICD-10-CM | POA: Diagnosis not present

## 2015-07-24 DIAGNOSIS — Z87891 Personal history of nicotine dependence: Secondary | ICD-10-CM | POA: Diagnosis not present

## 2015-07-24 DIAGNOSIS — Z9049 Acquired absence of other specified parts of digestive tract: Secondary | ICD-10-CM | POA: Diagnosis not present

## 2015-07-24 DIAGNOSIS — Z79899 Other long term (current) drug therapy: Secondary | ICD-10-CM | POA: Insufficient documentation

## 2015-07-24 DIAGNOSIS — I10 Essential (primary) hypertension: Secondary | ICD-10-CM | POA: Diagnosis not present

## 2015-07-24 DIAGNOSIS — K439 Ventral hernia without obstruction or gangrene: Secondary | ICD-10-CM | POA: Diagnosis present

## 2015-07-24 DIAGNOSIS — Z7901 Long term (current) use of anticoagulants: Secondary | ICD-10-CM | POA: Insufficient documentation

## 2015-07-24 HISTORY — PX: INCISIONAL HERNIA REPAIR: SHX193

## 2015-07-24 HISTORY — PX: INSERTION OF MESH: SHX5868

## 2015-07-24 SURGERY — REPAIR, HERNIA, INCISIONAL, LAPAROSCOPIC
Anesthesia: General | Site: Abdomen

## 2015-07-24 MED ORDER — 0.9 % SODIUM CHLORIDE (POUR BTL) OPTIME
TOPICAL | Status: DC | PRN
  Administered 2015-07-24: 1000 mL

## 2015-07-24 MED ORDER — MIDAZOLAM HCL 5 MG/5ML IJ SOLN
INTRAMUSCULAR | Status: DC | PRN
  Administered 2015-07-24: 2 mg via INTRAVENOUS

## 2015-07-24 MED ORDER — ALUM & MAG HYDROXIDE-SIMETH 200-200-20 MG/5ML PO SUSP
30.0000 mL | ORAL | Status: DC | PRN
  Administered 2015-07-24 – 2015-07-25 (×2): 30 mL via ORAL
  Filled 2015-07-24 (×3): qty 30

## 2015-07-24 MED ORDER — HYDRALAZINE HCL 20 MG/ML IJ SOLN: 10.0000 mg | INTRAMUSCULAR | Status: DC | PRN

## 2015-07-24 MED ORDER — CEFAZOLIN SODIUM-DEXTROSE 2-3 GM-% IV SOLR
2.0000 g | INTRAVENOUS | Status: AC
  Administered 2015-07-24: 2 g via INTRAVENOUS

## 2015-07-24 MED ORDER — PROPOFOL 10 MG/ML IV BOLUS
INTRAVENOUS | Status: DC | PRN
  Administered 2015-07-24: 180 mg via INTRAVENOUS

## 2015-07-24 MED ORDER — SODIUM CHLORIDE 0.9 % IJ SOLN
INTRAMUSCULAR | Status: AC
  Filled 2015-07-24: qty 50

## 2015-07-24 MED ORDER — SODIUM CHLORIDE 0.9 % IJ SOLN
INTRAMUSCULAR | Status: DC | PRN
  Administered 2015-07-24: 50 mL

## 2015-07-24 MED ORDER — METOCLOPRAMIDE HCL 5 MG/ML IJ SOLN
INTRAMUSCULAR | Status: DC | PRN
  Administered 2015-07-24: 10 mg via INTRAVENOUS

## 2015-07-24 MED ORDER — METOCLOPRAMIDE HCL 5 MG/ML IJ SOLN
INTRAMUSCULAR | Status: AC
  Filled 2015-07-24: qty 2

## 2015-07-24 MED ORDER — PHENYLEPHRINE HCL 10 MG/ML IJ SOLN
INTRAMUSCULAR | Status: DC | PRN
  Administered 2015-07-24 (×3): 80 ug via INTRAVENOUS

## 2015-07-24 MED ORDER — GLYCOPYRROLATE 0.2 MG/ML IJ SOLN
INTRAMUSCULAR | Status: DC | PRN
  Administered 2015-07-24: 0.2 mg via INTRAVENOUS

## 2015-07-24 MED ORDER — FENTANYL CITRATE (PF) 100 MCG/2ML IJ SOLN
INTRAMUSCULAR | Status: DC | PRN
  Administered 2015-07-24 (×2): 50 ug via INTRAVENOUS
  Administered 2015-07-24: 100 ug via INTRAVENOUS
  Administered 2015-07-24: 50 ug via INTRAVENOUS

## 2015-07-24 MED ORDER — LACTATED RINGERS IV SOLN: INTRAVENOUS | Status: DC

## 2015-07-24 MED ORDER — ONDANSETRON HCL 4 MG/2ML IJ SOLN
INTRAMUSCULAR | Status: AC
  Filled 2015-07-24: qty 2

## 2015-07-24 MED ORDER — ONDANSETRON HCL 4 MG/2ML IJ SOLN: 4.0000 mg | Freq: Four times a day (QID) | INTRAMUSCULAR | Status: DC | PRN

## 2015-07-24 MED ORDER — PHENYLEPHRINE 40 MCG/ML (10ML) SYRINGE FOR IV PUSH (FOR BLOOD PRESSURE SUPPORT)
PREFILLED_SYRINGE | INTRAVENOUS | Status: AC
  Filled 2015-07-24: qty 10

## 2015-07-24 MED ORDER — PANTOPRAZOLE SODIUM 40 MG IV SOLR
40.0000 mg | Freq: Every day | INTRAVENOUS | Status: DC
  Administered 2015-07-24: 40 mg via INTRAVENOUS
  Filled 2015-07-24 (×2): qty 40

## 2015-07-24 MED ORDER — KETOROLAC TROMETHAMINE 30 MG/ML IJ SOLN
INTRAMUSCULAR | Status: AC
  Filled 2015-07-24: qty 1

## 2015-07-24 MED ORDER — BUPIVACAINE LIPOSOME 1.3 % IJ SUSP
20.0000 mL | Freq: Once | INTRAMUSCULAR | Status: AC
  Administered 2015-07-24: 20 mL
  Filled 2015-07-24: qty 20

## 2015-07-24 MED ORDER — MORPHINE SULFATE (PF) 2 MG/ML IV SOLN
1.0000 mg | INTRAVENOUS | Status: DC | PRN
  Administered 2015-07-24 – 2015-07-25 (×4): 2 mg via INTRAVENOUS
  Filled 2015-07-24 (×4): qty 1

## 2015-07-24 MED ORDER — ROCURONIUM BROMIDE 100 MG/10ML IV SOLN
INTRAVENOUS | Status: DC | PRN
  Administered 2015-07-24 (×3): 5 mg via INTRAVENOUS
  Administered 2015-07-24: 40 mg via INTRAVENOUS
  Administered 2015-07-24: 10 mg via INTRAVENOUS

## 2015-07-24 MED ORDER — SUGAMMADEX SODIUM 200 MG/2ML IV SOLN
INTRAVENOUS | Status: DC | PRN
  Administered 2015-07-24: 200 mg via INTRAVENOUS

## 2015-07-24 MED ORDER — HYDROMORPHONE HCL 1 MG/ML IJ SOLN
INTRAMUSCULAR | Status: DC | PRN
  Administered 2015-07-24: 0.5 mg via INTRAVENOUS
  Administered 2015-07-24: 1 mg via INTRAVENOUS
  Administered 2015-07-24: 0.5 mg via INTRAVENOUS

## 2015-07-24 MED ORDER — DIPHENHYDRAMINE HCL 50 MG/ML IJ SOLN: 12.5000 mg | Freq: Four times a day (QID) | INTRAMUSCULAR | Status: DC | PRN

## 2015-07-24 MED ORDER — HYDROMORPHONE HCL 1 MG/ML IJ SOLN
INTRAMUSCULAR | Status: AC
  Filled 2015-07-24: qty 1

## 2015-07-24 MED ORDER — MIDAZOLAM HCL 2 MG/2ML IJ SOLN
INTRAMUSCULAR | Status: AC
  Filled 2015-07-24: qty 2

## 2015-07-24 MED ORDER — PROPOFOL 10 MG/ML IV BOLUS: INTRAVENOUS | Status: DC | PRN

## 2015-07-24 MED ORDER — KETOROLAC TROMETHAMINE 30 MG/ML IJ SOLN
30.0000 mg | Freq: Three times a day (TID) | INTRAMUSCULAR | Status: DC
  Administered 2015-07-24 – 2015-07-25 (×3): 30 mg via INTRAVENOUS
  Filled 2015-07-24 (×5): qty 1

## 2015-07-24 MED ORDER — HYDROMORPHONE HCL 1 MG/ML IJ SOLN
0.2500 mg | INTRAMUSCULAR | Status: DC | PRN
  Administered 2015-07-24 (×4): 0.5 mg via INTRAVENOUS

## 2015-07-24 MED ORDER — DEXAMETHASONE SODIUM PHOSPHATE 10 MG/ML IJ SOLN
INTRAMUSCULAR | Status: AC
  Filled 2015-07-24: qty 1

## 2015-07-24 MED ORDER — ONDANSETRON HCL 4 MG/2ML IJ SOLN
INTRAMUSCULAR | Status: AC
Start: 2015-07-24 — End: 2015-07-24
  Filled 2015-07-24: qty 2

## 2015-07-24 MED ORDER — LIDOCAINE HCL (CARDIAC) 20 MG/ML IV SOLN
INTRAVENOUS | Status: DC | PRN
  Administered 2015-07-24: 80 mg via INTRAVENOUS

## 2015-07-24 MED ORDER — CEFAZOLIN SODIUM-DEXTROSE 2-3 GM-% IV SOLR
INTRAVENOUS | Status: AC
  Filled 2015-07-24: qty 50

## 2015-07-24 MED ORDER — DIPHENHYDRAMINE HCL 12.5 MG/5ML PO ELIX: 12.5000 mg | ORAL_SOLUTION | Freq: Four times a day (QID) | ORAL | Status: DC | PRN

## 2015-07-24 MED ORDER — HYDROMORPHONE HCL 2 MG/ML IJ SOLN
INTRAMUSCULAR | Status: AC
  Filled 2015-07-24: qty 1

## 2015-07-24 MED ORDER — DEXAMETHASONE SODIUM PHOSPHATE 10 MG/ML IJ SOLN
INTRAMUSCULAR | Status: DC | PRN
  Administered 2015-07-24: 10 mg via INTRAVENOUS

## 2015-07-24 MED ORDER — ENOXAPARIN SODIUM 40 MG/0.4ML ~~LOC~~ SOLN
40.0000 mg | SUBCUTANEOUS | Status: DC
  Administered 2015-07-25: 40 mg via SUBCUTANEOUS
  Filled 2015-07-24 (×2): qty 0.4

## 2015-07-24 MED ORDER — ACETAMINOPHEN 500 MG PO TABS
1000.0000 mg | ORAL_TABLET | Freq: Four times a day (QID) | ORAL | Status: DC
  Administered 2015-07-24 – 2015-07-25 (×3): 1000 mg via ORAL
  Filled 2015-07-24 (×7): qty 2

## 2015-07-24 MED ORDER — OXYCODONE HCL 5 MG PO TABS
5.0000 mg | ORAL_TABLET | ORAL | Status: DC | PRN
  Administered 2015-07-24: 5 mg via ORAL
  Administered 2015-07-25: 10 mg via ORAL
  Filled 2015-07-24: qty 1
  Filled 2015-07-24: qty 2

## 2015-07-24 MED ORDER — PROPOFOL 10 MG/ML IV BOLUS
INTRAVENOUS | Status: AC
  Filled 2015-07-24: qty 20

## 2015-07-24 MED ORDER — METHOCARBAMOL 500 MG PO TABS: 500.0000 mg | ORAL_TABLET | Freq: Four times a day (QID) | ORAL | Status: DC | PRN

## 2015-07-24 MED ORDER — LACTATED RINGERS IV SOLN
INTRAVENOUS | Status: DC | PRN
  Administered 2015-07-24 (×2): via INTRAVENOUS

## 2015-07-24 MED ORDER — FENTANYL CITRATE (PF) 250 MCG/5ML IJ SOLN
INTRAMUSCULAR | Status: AC
Start: 2015-07-24 — End: 2015-07-24
  Filled 2015-07-24: qty 5

## 2015-07-24 MED ORDER — SIMETHICONE 80 MG PO CHEW
40.0000 mg | CHEWABLE_TABLET | Freq: Four times a day (QID) | ORAL | Status: DC | PRN
  Administered 2015-07-24 – 2015-07-25 (×2): 40 mg via ORAL
  Filled 2015-07-24 (×2): qty 1

## 2015-07-24 MED ORDER — ONDANSETRON 4 MG PO TBDP: 4.0000 mg | ORAL_TABLET | Freq: Four times a day (QID) | ORAL | Status: DC | PRN

## 2015-07-24 MED ORDER — ONDANSETRON HCL 4 MG/2ML IJ SOLN
INTRAMUSCULAR | Status: DC | PRN
  Administered 2015-07-24: 4 mg via INTRAVENOUS

## 2015-07-24 MED ORDER — KCL IN DEXTROSE-NACL 20-5-0.45 MEQ/L-%-% IV SOLN
INTRAVENOUS | Status: DC
Start: 2015-07-24 — End: 2015-07-25
  Administered 2015-07-24: 22:00:00 via INTRAVENOUS
  Filled 2015-07-24 (×2): qty 1000

## 2015-07-24 SURGICAL SUPPLY — 55 items
ADH SKN CLS APL DERMABOND .7 (GAUZE/BANDAGES/DRESSINGS) ×2
APL SKNCLS STERI-STRIP NONHPOA (GAUZE/BANDAGES/DRESSINGS)
BANDAGE ADH SHEER 1  50/CT (GAUZE/BANDAGES/DRESSINGS) IMPLANT
BENZOIN TINCTURE PRP APPL 2/3 (GAUZE/BANDAGES/DRESSINGS) ×1 IMPLANT
BINDER ABDOMINAL 12 ML 46-62 (SOFTGOODS) IMPLANT
CHLORAPREP W/TINT 26ML (MISCELLANEOUS) ×3 IMPLANT
COVER SURGICAL LIGHT HANDLE (MISCELLANEOUS) ×3 IMPLANT
DECANTER SPIKE VIAL GLASS SM (MISCELLANEOUS) ×3 IMPLANT
DERMABOND ADVANCED (GAUZE/BANDAGES/DRESSINGS) ×1
DERMABOND ADVANCED .7 DNX12 (GAUZE/BANDAGES/DRESSINGS) IMPLANT
DEVICE TROCAR PUNCTURE CLOSURE (ENDOMECHANICALS) ×5 IMPLANT
DISSECTOR BLUNT TIP ENDO 5MM (MISCELLANEOUS) IMPLANT
DRAPE LAPAROSCOPIC ABDOMINAL (DRAPES) ×3 IMPLANT
DRAPE UTILITY XL STRL (DRAPES) ×12 IMPLANT
DRSG TEGADERM 2-3/8X2-3/4 SM (GAUZE/BANDAGES/DRESSINGS) IMPLANT
ELECT PENCIL ROCKER SW 15FT (MISCELLANEOUS) IMPLANT
ELECT REM PT RETURN 9FT ADLT (ELECTROSURGICAL) ×3
ELECTRODE REM PT RTRN 9FT ADLT (ELECTROSURGICAL) ×2 IMPLANT
GLOVE BIOGEL M STRL SZ7.5 (GLOVE) IMPLANT
GLOVE INDICATOR 8.0 STRL GRN (GLOVE) ×3 IMPLANT
GLOVE SURG SIGNA 7.5 PF LTX (GLOVE) ×3 IMPLANT
GOWN STRL REUS W/TWL LRG LVL3 (GOWN DISPOSABLE) ×3 IMPLANT
GOWN STRL REUS W/TWL XL LVL3 (GOWN DISPOSABLE) ×6 IMPLANT
KIT BASIN OR (CUSTOM PROCEDURE TRAY) ×3 IMPLANT
MARKER SKIN DUAL TIP RULER LAB (MISCELLANEOUS) ×3 IMPLANT
MESH VENTRALIGHT ST 6IN CRC (Mesh General) ×1 IMPLANT
NDL INSUFFLATION 14GA 120MM (NEEDLE) IMPLANT
NDL SPNL 22GX3.5 QUINCKE BK (NEEDLE) ×1 IMPLANT
NEEDLE INSUFFLATION 14GA 120MM (NEEDLE) IMPLANT
NEEDLE SPNL 22GX3.5 QUINCKE BK (NEEDLE) ×3 IMPLANT
NS IRRIG 1000ML POUR BTL (IV SOLUTION) ×3 IMPLANT
PAD POSITIONING PINK XL (MISCELLANEOUS) IMPLANT
PAIN PUMP ON-Q 400MLX5ML 5IN (MISCELLANEOUS) ×6 IMPLANT
POSITIONER SURGICAL ARM (MISCELLANEOUS) IMPLANT
SCISSORS LAP 5X35 DISP (ENDOMECHANICALS) IMPLANT
SET IRRIG TUBING LAPAROSCOPIC (IRRIGATION / IRRIGATOR) IMPLANT
SHEARS HARMONIC ACE PLUS 36CM (ENDOMECHANICALS) IMPLANT
SOLUTION ANTI FOG 6CC (MISCELLANEOUS) ×3 IMPLANT
STRIP CLOSURE SKIN 1/2X4 (GAUZE/BANDAGES/DRESSINGS) ×6 IMPLANT
SUT MNCRL AB 4-0 PS2 18 (SUTURE) IMPLANT
SUT NOVA 0 T19/GS 22DT (SUTURE) ×2 IMPLANT
SUT NOVA NAB GS-21 0 18 T12 DT (SUTURE) ×2 IMPLANT
SUT VIC AB 3-0 SH 18 (SUTURE) ×2 IMPLANT
TACKER 5MM HERNIA 3.5CML NAB (ENDOMECHANICALS) IMPLANT
TAPE CLOTH 4X10 WHT NS (GAUZE/BANDAGES/DRESSINGS) IMPLANT
TOWEL OR 17X26 10 PK STRL BLUE (TOWEL DISPOSABLE) ×3 IMPLANT
TRAY FOLEY W/METER SILVER 14FR (SET/KITS/TRAYS/PACK) ×3 IMPLANT
TRAY FOLEY W/METER SILVER 16FR (SET/KITS/TRAYS/PACK) ×2 IMPLANT
TRAY LAPAROSCOPIC (CUSTOM PROCEDURE TRAY) ×3 IMPLANT
TROCAR BLADELESS OPT 5 100 (ENDOMECHANICALS) ×2 IMPLANT
TROCAR BLADELESS OPT 5 75 (ENDOMECHANICALS) ×3 IMPLANT
TROCAR XCEL BLUNT TIP 100MML (ENDOMECHANICALS) IMPLANT
TROCAR XCEL NON-BLD 11X100MML (ENDOMECHANICALS) ×3 IMPLANT
TROCAR XCEL UNIV SLVE 11M 100M (ENDOMECHANICALS) ×1 IMPLANT
TUBING INSUF HEATED (TUBING) ×3 IMPLANT

## 2015-07-24 NOTE — Progress Notes (Signed)
6th floor notified will be in room in 20 minutes.

## 2015-07-24 NOTE — Anesthesia Preprocedure Evaluation (Addendum)
Anesthesia Evaluation  Patient identified by MRN, date of birth, ID band Patient awake    Reviewed: Allergy & Precautions, H&P , NPO status , Patient's Chart, lab work & pertinent test results  Airway Mallampati: II  TM Distance: >3 FB Neck ROM: full    Dental  (+) Dental Advisory Given, Missing, Poor Dentition Only a few rotten teeth left upper and several missing lower front:   Pulmonary neg pulmonary ROS, former smoker,    Pulmonary exam normal breath sounds clear to auscultation       Cardiovascular Exercise Tolerance: Good hypertension, Pt. on medications Normal cardiovascular exam Rhythm:regular Rate:Normal     Neuro/Psych negative neurological ROS  negative psych ROS   GI/Hepatic negative GI ROS, Neg liver ROS,   Endo/Other  negative endocrine ROS  Renal/GU negative Renal ROS  negative genitourinary   Musculoskeletal   Abdominal   Peds  Hematology negative hematology ROS (+)   Anesthesia Other Findings   Reproductive/Obstetrics negative OB ROS                            Anesthesia Physical Anesthesia Plan  ASA: II  Anesthesia Plan: General   Post-op Pain Management:    Induction: Intravenous  Airway Management Planned: Oral ETT  Additional Equipment:   Intra-op Plan:   Post-operative Plan: Extubation in OR  Informed Consent: I have reviewed the patients History and Physical, chart, labs and discussed the procedure including the risks, benefits and alternatives for the proposed anesthesia with the patient or authorized representative who has indicated his/her understanding and acceptance.   Dental Advisory Given  Plan Discussed with: CRNA and Surgeon  Anesthesia Plan Comments:         Anesthesia Quick Evaluation

## 2015-07-24 NOTE — H&P (View-Only) (Signed)
Jocelyn [name] 06/21/2015 10:30 AM Location: [facility] Patient #: [id number] DOB: 11/17/1965 Single / Language: English / Race: White Female   History of Present Illness (Cardin Nitschke M. Sohil Timko MD; 06/22/2015 8:21 AM) Patient words: hernia.  The patient is a 49 year old female who presents with an incisional hernia. She is referred by Dr Brooks for evaluation of a recurrent hernia. She states that she had a laparoscopic cholecystectomy a few years ago. She had a recurrent hernia around her bellybutton that was repaired by Dr. Jenkins with mesh up at [place] in December 2015. He apparently used a 4.3 cm Bard mesh. She states that she had a recurrent hernia above her umbilicus. It hadn't been bothering her. [place] it became very tender and very uncomfortable prompting her to go to the emergency room. She states that it was reduced. She has had intermittent tenderness in the area. She also has apparently some chronic back issues and chose to undergo back surgery before getting the hernia evaluated. She had back surgery in mid December. She is still recovering. She states that she is no longer on narcotics. She is just on some muscle relaxant. She states that she quit smoking in October in preparation for her back surgery. She attests that she has not resumed smoking. She denies any chest pain, chest pressure, shortness of breath, dyspnea on exertion, orthopnea, nausea, vomiting, diarrhea. She does have some intermittent constipation. She occasionally has to take a laxative. She states the hernia has been bothering her lately. However it is always reducible.   Problem List/Past Medical (Kinsie Belford M Carolann Brazell, MD; 06/22/2015 8:27 AM) INCISIONAL HERNIA, WITHOUT OBSTRUCTION OR GANGRENE (K43.2) I reviewed his CT that she brought from her ER visit in the fall. The hernia was not that large on the CT at that point and only contained adipose tissue I believe  it has increased in size based on today's physical exam.  Other Problems (Nikhil Osei M Cassi Jenne, MD; 06/22/2015 8:27 AM) Arthritis Back Pain Inguinal Hernia  Past Surgical History (Sonya Bynum, CMA; 06/21/2015 10:56 AM) Gallbladder Surgery - Laparoscopic Spinal Surgery - Lower Back  Diagnostic Studies History (Sonya Bynum, CMA; 06/21/2015 10:56 AM) Colonoscopy never Mammogram within last year Pap Smear 1-5 years ago  Allergies (Sonya Bynum, CMA; 06/21/2015 10:57 AM) Tetracycline HCl *Tetracyclines** Hydrocodone-Acetaminophen *ANALGESICS - OPIOID*  Medication History (Sonya Bynum, CMA; 06/21/2015 10:58 AM) Lovenox (30MG/0.3ML Solution, Subcutaneous) Active. Lisinopril (10MG Tablet, Oral) Active. Robaxin (500MG Tablet, Oral) Active. Zofran (4MG Tablet, Oral) Active. Percocet (10-325MG Tablet, Oral) Active. Medications Reconciled  Social History (Sonya Bynum, CMA; 06/21/2015 10:56 AM) Alcohol use Occasional alcohol use. Caffeine use Carbonated beverages, Coffee. No drug use Tobacco use Former smoker.  Family History (Sonya Bynum, CMA; 06/21/2015 10:56 AM) Alcohol Abuse Brother, Father. Depression Daughter, Mother, Sister. Hypertension Father.  Pregnancy / Birth History (Sonya Bynum, CMA; 06/21/2015 10:56 AM) Age at menarche 16 years. Gravida 4 Irregular periods Maternal age 15-20 Para 4    Review of Systems (Sonya Bynum CMA; 06/21/2015 10:56 AM) General Not Present- Appetite Loss, Chills, Fatigue, Fever, Night Sweats, Weight Gain and Weight Loss. Skin Not Present- Change in Wart/Mole, Dryness, Hives, Jaundice, New Lesions, Non-Healing Wounds, Rash and Ulcer. HEENT Present- Wears glasses/contact lenses. Not Present- Earache, Hearing Loss, Hoarseness, Nose Bleed, Oral Ulcers, Ringing in the Ears, Seasonal Allergies, Sinus Pain, Sore Throat, Visual Disturbances and Yellow Eyes. Respiratory Not Present- Bloody sputum, Chronic Cough, Difficulty Breathing,  Snoring and Wheezing. Breast   Not Present- Breast Mass, Breast Pain, Nipple Discharge and Skin Changes. Cardiovascular Not Present- Chest Pain, Difficulty Breathing Lying Down, Leg Cramps, Palpitations, Rapid Heart Rate, Shortness of Breath and Swelling of Extremities. Gastrointestinal Present- Abdominal Pain, Bloating and Excessive gas. Not Present- Bloody Stool, Change in Bowel Habits, Chronic diarrhea, Constipation, Difficulty Swallowing, Gets full quickly at meals, Hemorrhoids, Indigestion, Nausea, Rectal Pain and Vomiting. Female Genitourinary Present- Frequency and Nocturia. Not Present- Painful Urination, Pelvic Pain and Urgency. Musculoskeletal Present- Back Pain and Joint Stiffness. Not Present- Joint Pain, Muscle Pain, Muscle Weakness and Swelling of Extremities. Neurological Not Present- Decreased Memory, Fainting, Headaches, Numbness, Seizures, Tingling, Tremor, Trouble walking and Weakness. Psychiatric Not Present- Anxiety, Bipolar, Change in Sleep Pattern, Depression, Fearful and Frequent crying. Endocrine Not Present- Cold Intolerance, Excessive Hunger, Hair Changes, Heat Intolerance, Hot flashes and New Diabetes. Hematology Not Present- Easy Bruising, Excessive bleeding, Gland problems, HIV and Persistent Infections.  Vitals (Sonya Bynum CMA; 06/21/2015 10:57 AM) 06/21/2015 10:56 AM Weight: 178 lb Height: 64in Body Surface Area: 1.86 m Body Mass Index: 30.55 kg/m  Temp.: 97.8F(Temporal)  Pulse: 81 (Regular)  BP: 128/76 (Sitting, Left Arm, Standard)       Physical Exam (Ziyad Dyar M. Dominga Mcduffie MD; 06/22/2015 8:22 AM) General Mental Status-Alert. General Appearance-Consistent with stated age. Hydration-Well hydrated. Voice-Normal. Note: overweight   Head and Neck Head-normocephalic, atraumatic with no lesions or palpable masses. Trachea-midline. Thyroid Gland Characteristics - normal size and consistency.  Eye Eyeball - Bilateral-Extraocular  movements intact. Sclera/Conjunctiva - Bilateral-No scleral icterus.  Chest and Lung Exam Chest and lung exam reveals -quiet, even and easy respiratory effort with no use of accessory muscles and on auscultation, normal breath sounds, no adventitious sounds and normal vocal resonance. Inspection Chest Wall - Normal. Back - normal.  Breast - Did not examine.  Cardiovascular Cardiovascular examination reveals -normal heart sounds, regular rate and rhythm with no murmurs and normal pedal pulses bilaterally.  Abdomen Inspection  Inspection of the abdomen reveals: Note: obvious supraumbilical bulge. skin ok. soft, mild TTP. reducible. defect feels about 3 x 2.5 cm. Skin - Scar - Note: old umbilical incision. Palpation/Percussion Palpation and Percussion of the abdomen reveal - Soft, Non Tender, No Rebound tenderness, No Rigidity (guarding) and No hepatosplenomegaly. Auscultation Auscultation of the abdomen reveals - Bowel sounds normal.  Peripheral Vascular Upper Extremity Palpation - Pulses bilaterally normal.  Neurologic Neurologic evaluation reveals -alert and oriented x 3 with no impairment of recent or remote memory. Mental Status-Normal.  Neuropsychiatric The patient's mood and affect are described as -normal. Judgment and Insight-insight is appropriate concerning matters relevant to self.  Musculoskeletal Normal Exam - Left-Upper Extremity Strength Normal and Lower Extremity Strength Normal. Normal Exam - Right-Upper Extremity Strength Normal and Lower Extremity Strength Normal.  Lymphatic Head & Neck  General Head & Neck Lymphatics: Bilateral - Description - Normal. Axillary - Did not examine. Femoral & Inguinal - Did not examine.    Assessment & Plan (Aowyn Rozeboom M. Shantanu Strauch MD; 06/22/2015 8:27 AM) INCISIONAL HERNIA, WITHOUT OBSTRUCTION OR GANGRENE (K43.2) Story: I reviewed his CT that she brought from her ER visit in the fall. The hernia was not that  large on the CT at that point and only contained adipose tissue I believe it has increased in size based on today's physical exam. Impression: We discussed the etiology of ventral incisional hernias. We discussed the signs and symptoms of incarceration and strangulation. The patient was given educational material. I also drew diagrams.  We discussed nonoperative and operative management.   With respect to operative management, we discussed both open repair and laparoscopic repair. We discussed the pros and cons of each approach. I discussed the typical aftercare with each procedure and how each procedure differs. I recommended reapproximating the muscle to restore her abdominal wall anatomay given her young age.  The patient has elected to laparoscopic assisted incisional hernia repair with mesh.  We discussed the risk and benefits of surgery including but not limited to bleeding, infection, injury to surrounding structures, hernia recurrence, mesh complications, hematoma/seroma formation, need to convert to an open procedure, blood clot formation, urinary retention, post operative ileus, general anesthesia risk, long-term abdominal pain. We discussed that this procedure can be quite uncomfortable and difficult to recover from based on how the mesh is secured to the abdominal wall. We discussed the importance of avoiding heavy lifting and straining for a period of 6 weeks. I stressed the importance of reframing from smoking since it would definitely increase risk of infection and recurrence. She and her husband voiced understanding. Current Plans Pt Education - Pamphlet Given - Hernia Surgery: discussed with patient and provided information. You are being scheduled for surgery - Our schedulers will call you.  You should hear from our office's scheduling department within 5 working days about the location, date, and time of surgery. We try to make accommodations for patient's preferences in scheduling  surgery, but sometimes the OR schedule or the surgeon's schedule prevents us from making those accommodations.  If you have not heard from our office ([phone]) in 5 working days, call the office and ask for your surgeon's nurse.  If you have other questions about your diagnosis, plan, or surgery, call the office and ask for your surgeon's nurse.  Pt Education - [facility] Pain Control (Gross) Pt Education - [facility] Hernia Post-Op HCI (Gross): discussed with patient and provided information.  Jahkari Maclin M. Haniah Penny, MD, FACS General, Bariatric, & Minimally Invasive Surgery [facility] [place]  

## 2015-07-24 NOTE — Interval H&P Note (Signed)
History and Physical Interval Note:  07/24/2015 1:08 PM  Jocelyn [name]  has presented today for surgery, with the diagnosis of Incisional hernia   The various methods of treatment have been discussed with the patient and family. After consideration of risks, benefits and other options for treatment, the patient has consented to  Procedure(s): LAPAROSCOPIC INCISIONAL HERNIA REPAIR WITH MESH (N/A) INSERTION OF MESH (N/A) as a surgical intervention .  The patient's history has been reviewed, patient examined, no change in status, stable for surgery.  I have reviewed the patient's chart and labs.  Questions were answered to the patient's satisfaction.    Mary SellaEric M. Andrey CampanileWilson, MD, FACS General, Bariatric, & Minimally Invasive Surgery [facility] [place]    [facility]ILSON,Casandra Dallaire M

## 2015-07-24 NOTE — Anesthesia Postprocedure Evaluation (Signed)
Anesthesia Post Note  Patient: Jocelyn [name]  Procedure(s) Performed: Procedure(s) (LRB): LAPAROSCOPIC VENTRAL HERNIA REPAIR WITH MESH (N/A) INSERTION OF MESH (N/A)  Patient location during evaluation: PACU Anesthesia Type: General Level of consciousness: awake and alert Pain management: pain level controlled Vital Signs Assessment: post-procedure vital signs reviewed and stable Respiratory status: spontaneous breathing, nonlabored ventilation, respiratory function stable and patient connected to nasal cannula oxygen Cardiovascular status: blood pressure returned to baseline and stable Postop Assessment: no signs of nausea or vomiting Anesthetic complications: no    Last Vitals:  Filed Vitals:   07/24/15 1615 07/24/15 1652  BP: 132/88 127/71  Pulse: 91 88  Temp: 36.3 C 36.8 C  Resp: 22 16    Last Pain:  Filed Vitals:   07/24/15 1737  PainSc: 7                  Elimelech Houseman L

## 2015-07-24 NOTE — Progress Notes (Signed)
Floor reports staff not ready for pt unitl 1650

## 2015-07-24 NOTE — Transfer of Care (Signed)
Immediate Anesthesia Transfer of Care Note  Patient: Jocelyn [name]  Procedure(s) Performed: Procedure(s): LAPAROSCOPIC VENTRAL HERNIA REPAIR WITH MESH (N/A) INSERTION OF MESH (N/A)  Patient Location: PACU  Anesthesia Type:General  Level of Consciousness:  sedated, patient cooperative and responds to stimulation  Airway & Oxygen Therapy:Patient Spontanous Breathing and Patient connected to face mask oxgen  Post-op Assessment:  Report given to PACU RN and Post -op Vital signs reviewed and stable  Post vital signs:  Reviewed and stable  Last Vitals:  Filed Vitals:   07/24/15 1041  BP: 126/81  Pulse: 83  Temp: 36.6 C  Resp: 16    Complications: No apparent anesthesia complications

## 2015-07-24 NOTE — Op Note (Signed)
Jocelyn BogaDee A Denzler [id number] 1965/08/23 07/24/2015  Laparoscopic Assisted Ventral Hernia Repair with Mesh Procedure Note  Indications: Symptomatic supraumbilical ventral hernia  Pre-operative Diagnosis: supraumbilical ventral hernia  Post-operative Diagnosis: same  Surgeon: Atilano InaWILSON,Joal Eakle M   Assistants: none  Anesthesia: General endotracheal anesthesia + 70cc exparel  Procedure Details  The patient was seen in the Holding Room. The risks, benefits, complications, treatment options, and expected outcomes were discussed with the patient. The possibilities of reaction to medication, pulmonary aspiration, perforation of viscus, bleeding, recurrent infection, the need for additional procedures, failure to diagnose a condition, and creating a complication requiring transfusion or operation were discussed with the patient. The patient concurred with the proposed plan, giving informed consent.  The site of surgery properly noted/marked. The patient was taken to the operating room, identified as Jocelyn [name] and the procedure verified as laparoscopic ventral hernia repair with mesh. A Time Out was held and the above information confirmed.    The patient was placed supine.  After establishing general anesthesia, a Foley catheter was placed.  The abdomen was prepped with Chloraprep and draped in standard fashion.  A 5 mm Optiview was used the cannulate the peritoneal cavity in the left upper quadrant below the costal margin.  Pneumoperitoneum was obtained by insufflating CO2, maintaining a maximum pressure of 15 mmHg.  The 5 mm 30-degree laparoscopic was inserted.  There was a portion of omentum and transverse colon in a supraumbilical hernia. There was evidence of her prior umbilical hernia repair with mesh. The mesh was flush with the abdominal wall and well incorporated and peritonealized over.  An 5-mm port was placed in the left anterior axillary line at the level of the umbilicus.  The contents of the  hernia were easily reduced.  endoshears with electrocautery was used to take down the falciform ligament. I did this because it was where mesh would need to be placed.   We cleared the entire abdominal wall and were able to visualize 1 fascial defect.  We used a spinal needle to identify the extent of the hernia defect.  It measured 4cm by 4 cm.   At this point pneumoperitoneum was released and I made a small vertical supraumbilical incision with a 15 blade directly over the hernia. Dissection was carried down to the hernia sac located above the fascia and was mobilized from surrounding structures. Hernia sac excised and discarded. Intact fascia was identified circumferentially around the defect. Skin and soft tissue was mobilized from the surface of the fascia in a circumferential manner.  A round Bard 15 cm VentralightST mesh  was then placed thru the defect after placing four 0 novafil stay sutures around the periphery of the mesh.   The fascia was then closed transversely and  primarily over the mesh with 6 interrupted 0-novafil sutures.  Pneumoperitoneum was re-established. There was no air leak.   The mesh was then unrolled.  The stay sutures were then pulled up through small stab incisions using the Endo-close device.  This deployed the mesh widely over the fascial defects.  The stay sutures were then tied down.  The Secure Strap device was then used to tack down the edges of the mesh at 1 cm intervals circumferentially. I placed 1 metal tack from another device before switching to my preferred secure strap device.  We placed a few tacks inside the outer ring of tacks. 25 secure strap dissolvable tacks were placed.  We inspected for hemostasis.  Exparel was infiltrated in the preperitoneal  space all around the edge of the mesh, transfascial suture sites.   Pneumoperitoneum was then released as we removed the remainder of the trocars.  3-0 inverted vicryls were placed at the supraumbilical site. All  incisions and port sites were closed with 4-0 Monocryl.  All of the incisions and stay suture sites were then sealed with Dermabond.  An abdominal binder was placed around the patient's abdomen.  The patient was extubated and brought to the recovery room in stable condition.  All sponge, instrument, and needle counts were correct prior to closure and at the conclusion of the case.   Findings: Old umbilical hernia repair with mesh intact and looked good Type of repair - primary suture with mesh underlay  (choices - primary suture, mesh, or component)  Name of mesh - bard ventralightST  Size of mesh -15 cm round  Mesh overlap - >5 cm  Placement of mesh - beneath fascia and into peritoneal cavity  (choices - beneath fascia and into peritoneal cavity, beneath fascia but external to peritoneal cavity, between the muscle and fascia, above or external to fascia)  Estimated Blood Loss:  Minimal         Complications:  None; patient tolerated the procedure well.         Disposition: PACU - hemodynamically stable.         Condition: stable  Mary Sella. Andrey Campanile, MD, FACS General, Bariatric, & Minimally Invasive Surgery [facility], [place]

## 2015-07-25 DIAGNOSIS — K432 Incisional hernia without obstruction or gangrene: Secondary | ICD-10-CM | POA: Diagnosis not present

## 2015-07-25 LAB — CBC
HCT: 43.1 % (ref 36.0–46.0)
Hemoglobin: 14.2 g/dL (ref 12.0–15.0)
MCH: 30.6 pg (ref 26.0–34.0)
MCHC: 32.9 g/dL (ref 30.0–36.0)
MCV: 92.9 fL (ref 78.0–100.0)
PLATELETS: 322 10*3/uL (ref 150–400)
RBC: 4.64 MIL/uL (ref 3.87–5.11)
RDW: 13.3 % (ref 11.5–15.5)
WBC: 16.3 10*3/uL — AB (ref 4.0–10.5)

## 2015-07-25 MED ORDER — OXYCODONE HCL 5 MG PO TABS: 5.0000 mg | ORAL_TABLET | ORAL | Status: DC | PRN

## 2015-07-25 MED ORDER — PANTOPRAZOLE SODIUM 40 MG PO TBEC: 40.0000 mg | DELAYED_RELEASE_TABLET | Freq: Every day | ORAL | Status: DC

## 2015-07-25 MED ORDER — GI COCKTAIL ~~LOC~~
30.0000 mL | Freq: Once | ORAL | Status: AC
  Administered 2015-07-25: 30 mL via ORAL
  Filled 2015-07-25: qty 30

## 2015-07-25 NOTE — Progress Notes (Signed)
PHARMACIST - PHYSICIAN COMMUNICATION  CONCERNING: IV to Oral Route Change Policy  RECOMMENDATION: This patient is receiving pantoprazole by the intravenous route.  Based on criteria approved by the Pharmacy and Therapeutics Committee, the intravenous medication(s) is/are being converted to the equivalent oral dose form(s).   DESCRIPTION: These criteria include:  The patient is eating (either orally or via tube) and/or has been taking other orally administered medications for a least 24 hours  The patient has no evidence of active gastrointestinal bleeding or impaired GI absorption (gastrectomy, short bowel, patient on TNA or NPO).  If you have questions about this conversion, please contact the Pharmacy Department  []   [phone]( [phone] )  [facility] []   [phone]( [phone] )  [facility] []   [phone]( [phone] )  [facility] []   [phone]( [phone] )  [facility] [x]   [phone]( [phone] )  [facility]   Grace IsaacYuhong  Lakeysha Slutsky, PharmD candidate  07/25/2015 7:56 AM

## 2015-07-25 NOTE — Discharge Summary (Signed)
Physician Discharge Summary  Jocelyn [name]:[id number] DOB: 11-18-1965 DOA: 07/24/2015  PCP: Dwana Melena, MD  Admit date: 07/24/2015 Discharge date: 07/25/2015  Recommendations for Outpatient Follow-up:   Follow-up Information    Follow up with Atilano Ina, MD. Schedule an appointment as soon as possible for a visit in 3 weeks.   Specialty:  General Surgery   Why:  For wound re-check   Contact information:   [address] [place] [address] [phone]      Discharge Diagnoses:  Active Problems:   Ventral hernia  Surgical Procedure: laparoscopic assisted ventral hernia repair with mesh  Discharge Condition: good Disposition: home  Diet recommendation: regular  Filed Weights   07/24/15 1045 07/24/15 1700  Weight: 80.287 kg (177 lb) 80.287 kg (177 lb)   Hospital Course:  She came in for planned lap assist repair of supraumbilical hernia with mesh. Please see op note for additional details. She was kept overnight for observation and pain control. On POD 1 she was doing well and wanting to go home. She was tolerating a diet, her vitals were stable, her pain was controlled. She was ambulating. We discussed dc instructions.   BP 131/80 mmHg  Pulse 75  Temp(Src) 98.2 F (36.8 C) (Oral)  Resp 16  Ht  (1.626 m)  Wt 80.287 kg (177 lb)  BMI 30.37 kg/m2  SpO2 98%  Gen: alert, NAD, non-toxic appearing Pupils: equal, no scleral icterus Pulm: Lungs clear to auscultation, symmetric chest rise CV: regular rate and rhythm Abd: soft, approp tenderness, nondistended. Some abdominal wall brusising. No cellulitis. No incisional hernia. +binder Ext: no edema, no calf tenderness Skin: no rash, no jaundice    Discharge Instructions  Discharge Instructions    Call MD for:  hives    Complete by:  As directed      Call MD for:  persistant dizziness or light-headedness    Complete by:  As directed      Call MD for:  persistant nausea and vomiting    Complete  by:  As directed      Call MD for:  redness, tenderness, or signs of infection (pain, swelling, redness, odor or green/yellow discharge around incision site)    Complete by:  As directed      Call MD for:  severe uncontrolled pain    Complete by:  As directed      Call MD for:    Complete by:  As directed   Temperature >101     Diet general    Complete by:  As directed      Discharge instructions    Complete by:  As directed   See [facility] discharge instructions     Increase activity slowly    Complete by:  As directed             Medication List    STOP taking these medications        enoxaparin 30 MG/0.3ML injection  Commonly known as:  LOVENOX     oxyCODONE-acetaminophen 10-325 MG tablet  Commonly known as:  PERCOCET      TAKE these medications        acetaminophen 325 MG tablet  Commonly known as:  TYLENOL  Take 650 mg by mouth every 6 (six) hours as needed for mild pain or headache.     ADVIL PM 200-25 MG Caps  Generic drug:  Ibuprofen-Diphenhydramine HCl  Take 1 tablet by mouth at bedtime as needed (Sleep).  lisinopril 10 MG tablet  Commonly known as:  PRINIVIL,ZESTRIL  Take 10 mg by mouth daily as needed.     methocarbamol 500 MG tablet  Commonly known as:  ROBAXIN  Take 1 tablet (500 mg total) by mouth 3 (three) times daily as needed for muscle spasms.     ondansetron 4 MG tablet  Commonly known as:  ZOFRAN  Take 1 tablet (4 mg total) by mouth every 8 (eight) hours as needed for nausea or vomiting.     oxyCODONE 5 MG immediate release tablet  Commonly known as:  Oxy IR/ROXICODONE  Take 1-2 tablets (5-10 mg total) by mouth every 4 (four) hours as needed for moderate pain.     traMADol 50 MG tablet  Commonly known as:  ULTRAM  Take 50 mg by mouth every 6 (six) hours as needed (Pain).           Follow-up Information    Follow up with Atilano InaWILSON,Yasira Engelson M, MD. Schedule an appointment as soon as possible for a visit in 3 weeks.   Specialty:  General  Surgery   Why:  For wound re-check   Contact information:   [address] [place] [address] [phone]        The results of significant diagnostics from this hospitalization (including imaging, microbiology, ancillary and laboratory) are listed below for reference.    Significant Diagnostic Studies: No results found.  Microbiology: No results found for this or any previous visit (from the past 240 hour(s)).   Labs: Basic Metabolic Panel:  Recent Labs Lab 07/19/15 1030  NA 138  K 4.1  CL 105  CO2 24  GLUCOSE 104*  BUN 10  CREATININE 0.85  CALCIUM 9.2   Liver Function Tests:  Recent Labs Lab 07/19/15 1030  AST 16  ALT 11*  ALKPHOS 93  BILITOT 0.3  PROT 8.3*  ALBUMIN 4.4   No results for input(s): LIPASE, AMYLASE in the last 168 hours. No results for input(s): AMMONIA in the last 168 hours. CBC:  Recent Labs Lab 07/19/15 1030 07/25/15 0432  WBC 9.3 16.3*  NEUTROABS 6.2  --   HGB 16.1* 14.2  HCT 48.5* 43.1  MCV 93.8 92.9  PLT 332 322   Cardiac Enzymes: No results for input(s): CKTOTAL, CKMB, CKMBINDEX, TROPONINI in the last 168 hours. BNP: BNP (last 3 results) No results for input(s): BNP in the last 8760 hours.  ProBNP (last 3 results) No results for input(s): PROBNP in the last 8760 hours.  CBG: No results for input(s): GLUCAP in the last 168 hours.  Active Problems:   Ventral hernia   Time coordinating discharge: 15 minutes  Signed:  Atilano InaEric M Conal Shetley, MD [facility], [place] 252 089 [phone] 07/25/2015, 8:58 AM

## 2015-07-25 NOTE — Discharge Instructions (Signed)
[facility]  UMBILICAL OR INGUINAL HERNIA REPAIR: POST OP INSTRUCTIONS  Always review your discharge instruction sheet given to you by the facility where your surgery was performed. IF YOU HAVE DISABILITY OR FAMILY LEAVE FORMS, YOU MUST BRING THEM TO THE OFFICE FOR PROCESSING.   DO NOT GIVE THEM TO YOUR DOCTOR.  1. A  prescription for pain medication may be given to you upon discharge.  Take your pain medication as prescribed, if needed.  If narcotic pain medicine is not needed, then you may take acetaminophen (Tylenol) or ibuprofen (Advil) as needed. 2. Take your usually prescribed medications unless otherwise directed. 3. If you need a refill on your pain medication, please contact your pharmacy.  They will contact our office to request authorization. Prescriptions will not be filled after 5 pm or on week-ends. 4. You should follow a light diet the first 24 hours after arrival home, such as soup and crackers, etc.  Be sure to include lots of fluids daily.  Resume your normal diet the day after surgery. 5. Most patients will experience some swelling and bruising around the umbilicus or in the groin and scrotum.  Ice packs and reclining will help.  Swelling and bruising can take several days to resolve.  6. It is common to experience some constipation if taking pain medication after surgery.  Increasing fluid intake and taking a stool softener (such as Colace) will usually help or prevent this problem from occurring.  A mild laxative (Milk of Magnesia or Miralax) should be taken according to package directions if there are no bowel movements after 48 hours. 7.  If your surgeon used skin glue on the incision, you may shower in 24 hours.  The glue will flake off over the next 2-3 weeks.  Any sutures or staples will be removed at the office during your follow-up visit. 8. ACTIVITIES:  You may resume regular (light) daily activities beginning the next day--such as daily self-care,  walking, climbing stairs--gradually increasing activities as tolerated.  You may have sexual intercourse when it is comfortable.  Refrain from any heavy lifting or straining until approved by your doctor. a. You may drive when you are no longer taking prescription pain medication, you can comfortably wear a seatbelt, and you can safely maneuver your car and apply brakes. b. RETURN TO WORK:  9. You should see your doctor in the office for a follow-up appointment approximately 2-3 weeks after your surgery.  Make sure that you call for this appointment within a day or two after you arrive home to insure a convenient appointment time. 10. OTHER INSTRUCTIONS: WEAR ABDOMINAL BINDER DURING DAYTIME 11. DO NOT LIFT, PUSH, OR PULL ANYTHING GREATER THAN 10 POUNDS FOR 6 WEEKS 12. CAN TAKE AN NSAID (MOTRIN, ALEVE, OR IBUPROFEN) PLUS TYLENOL AS NEEDED FOR PAIN. Follow package directions    WHEN TO CALL YOUR DOCTOR: 1. Fever over 101.0 2. Inability to urinate 3. Nausea and/or vomiting 4. Extreme swelling or bruising 5. Continued bleeding from incision. 6. Increased pain, redness, or drainage from the incision  The clinic staff is available to answer your questions during regular business hours.  Please dont hesitate to call and ask to speak to one of the nurses for clinical concerns.  If you have a medical emergency, go to the nearest emergency room or call 911.  A surgeon from [facility] is always on call at the hospital   [address], [address], [place], [place]  [phone] ?  [address] [address], [place], [place]   [address] °[phone] ? [phone] ? FAX [phone] °Web site: [email or web address] ° °

## 2015-10-24 ENCOUNTER — Encounter (HOSPITAL_COMMUNITY): Payer: Self-pay | Admitting: *Deleted

## 2015-10-24 ENCOUNTER — Emergency Department (HOSPITAL_COMMUNITY)
Admission: EM | Admit: 2015-10-24 | Discharge: 2015-10-24 | Disposition: A | Discharge status: Home / Self Care | Payer: BLUE CROSS/BLUE SHIELD | Attending: Emergency Medicine | Admitting: Emergency Medicine

## 2015-10-24 DIAGNOSIS — L259 Unspecified contact dermatitis, unspecified cause: Secondary | ICD-10-CM

## 2015-10-24 DIAGNOSIS — I10 Essential (primary) hypertension: Secondary | ICD-10-CM | POA: Insufficient documentation

## 2015-10-24 DIAGNOSIS — Z87891 Personal history of nicotine dependence: Secondary | ICD-10-CM | POA: Insufficient documentation

## 2015-10-24 DIAGNOSIS — R21 Rash and other nonspecific skin eruption: Secondary | ICD-10-CM | POA: Diagnosis present

## 2015-10-24 DIAGNOSIS — Z79899 Other long term (current) drug therapy: Secondary | ICD-10-CM | POA: Diagnosis not present

## 2015-10-24 LAB — CBG MONITORING, ED: GLUCOSE-CAPILLARY: 124 mg/dL — AB (ref 65–99)

## 2015-10-24 MED ORDER — DEXAMETHASONE SODIUM PHOSPHATE 10 MG/ML IJ SOLN
10.0000 mg | Freq: Once | INTRAMUSCULAR | Status: AC
  Administered 2015-10-24: 10 mg via INTRAMUSCULAR
  Filled 2015-10-24: qty 1

## 2015-10-24 MED ORDER — DIPHENHYDRAMINE HCL 25 MG PO CAPS
50.0000 mg | ORAL_CAPSULE | Freq: Once | ORAL | Status: AC
  Administered 2015-10-24: 50 mg via ORAL
  Filled 2015-10-24: qty 2

## 2015-10-24 MED ORDER — FAMOTIDINE 20 MG PO TABS
20.0000 mg | ORAL_TABLET | Freq: Once | ORAL | Status: AC
  Administered 2015-10-24: 20 mg via ORAL
  Filled 2015-10-24: qty 1

## 2015-10-24 MED ORDER — PREDNISONE 20 MG PO TABS: ORAL_TABLET | ORAL | Status: DC

## 2015-10-24 NOTE — Discharge Instructions (Signed)
Soak in a tub of baking soda water for comfort. Take benadryl 50 mg every 6 hrs for itching or zyrtec 10 mg once a day (will not make you as sleepy as benadryl). Take the prednisone until gone. Take pepcid twice a day for itching and swelling. Recheck with Dr Margo AyeHall if not improving in the next 2-3 days.    Contact Dermatitis Dermatitis is redness, soreness, and swelling (inflammation) of the skin. Contact dermatitis is a reaction to certain substances that touch the skin. There are two types of contact dermatitis:   Irritant contact dermatitis. This type is caused by something that irritates your skin, such as dry hands from washing them too much. This type does not require previous exposure to the substance for a reaction to occur. This type is more common.  Allergic contact dermatitis. This type is caused by a substance that you are allergic to, such as a nickel allergy or poison ivy. This type only occurs if you have been exposed to the substance (allergen) before. Upon a repeat exposure, your body reacts to the substance. This type is less common. CAUSES  Many different substances can cause contact dermatitis. Irritant contact dermatitis is most commonly caused by exposure to:   Makeup.   Soaps.   Detergents.   Bleaches.   Acids.   Metal salts, such as nickel.  Allergic contact dermatitis is most commonly caused by exposure to:   Poisonous plants.   Chemicals.   Jewelry.   Latex.   Medicines.   Preservatives in products, such as clothing.  RISK FACTORS This condition is more likely to develop in:   People who have jobs that expose them to irritants or allergens.  People who have certain medical conditions, such as asthma or eczema.  SYMPTOMS  Symptoms of this condition may occur anywhere on your body where the irritant has touched you or is touched by you. Symptoms include:  Dryness or flaking.   Redness.   Cracks.   Itching.   Pain or a  burning feeling.   Blisters.  Drainage of small amounts of blood or clear fluid from skin cracks. With allergic contact dermatitis, there may also be swelling in areas such as the eyelids, mouth, or genitals.  DIAGNOSIS  This condition is diagnosed with a medical history and physical exam. A patch skin test may be performed to help determine the cause. If the condition is related to your job, you may need to see an occupational medicine specialist. TREATMENT Treatment for this condition includes figuring out what caused the reaction and protecting your skin from further contact. Treatment may also include:   Steroid creams or ointments. Oral steroid medicines may be needed in more severe cases.  Antibiotics or antibacterial ointments, if a skin infection is present.  Antihistamine lotion or an antihistamine taken by mouth to ease itching.  A bandage (dressing). HOME CARE INSTRUCTIONS Skin Care  Moisturize your skin as needed.   Apply cool compresses to the affected areas.  Try taking a bath with:  Epsom salts. Follow the instructions on the packaging. You can get these at your local pharmacy or grocery store.  Baking soda. Pour a small amount into the bath as directed by your health care provider.  Colloidal oatmeal. Follow the instructions on the packaging. You can get this at your local pharmacy or grocery store.  Try applying baking soda paste to your skin. Stir water into baking soda until it reaches a paste-like consistency.  Do not scratch  your skin.  Bathe less frequently, such as every other day.  Bathe in lukewarm water. Avoid using hot water. Medicines  Take or apply over-the-counter and prescription medicines only as told by your health care provider.   If you were prescribed an antibiotic medicine, take or apply your antibiotic as told by your health care provider. Do not stop using the antibiotic even if your condition starts to improve. General  Instructions  Keep all follow-up visits as told by your health care provider. This is important.  Avoid the substance that caused your reaction. If you do not know what caused it, keep a journal to try to track what caused it. Write down:  What you eat.  What cosmetic products you use.  What you drink.  What you wear in the affected area. This includes jewelry.  If you were given a dressing, take care of it as told by your health care provider. This includes when to change and remove it. SEEK MEDICAL CARE IF:   Your condition does not improve with treatment.  Your condition gets worse.  You have signs of infection such as swelling, tenderness, redness, soreness, or warmth in the affected area.  You have a fever.  You have new symptoms. SEEK IMMEDIATE MEDICAL CARE IF:   You have a severe headache, neck pain, or neck stiffness.  You vomit.  You feel very sleepy.  You notice red streaks coming from the affected area.  Your bone or joint underneath the affected area becomes painful after the skin has healed.  The affected area turns darker.  You have difficulty breathing.   This information is not intended to replace advice given to you by your health care provider. Make sure you discuss any questions you have with your health care provider.   Document Released: 04/19/2000 Document Revised: 01/11/2015 Document Reviewed: 09/07/2014 Elsevier Interactive Patient Education [employer]

## 2015-10-24 NOTE — ED Provider Notes (Signed)
CSN: [id number]     Arrival date & time 10/24/15  0141 History   First MD Initiated Contact with Patient 10/24/15 0405 AM   Chief Complaint  Patient presents with  . Rash     (Consider location/radiation/quality/duration/timing/severity/associated sxs/prior Treatment) HPI patient reports about 2 weeks ago she was weed eating wearing shorts and she had shaved her groin and developed razor burns. She was treated with prednisone and it got better. She states the rash returned 5 days ago and she states she has a lot of itching and burning in her groin. Her doctor called in a cream today however when she applies it it burns. He denies a history of diabetes. She denies any drainage. She denies any change in her sexual partners.   PCP Dr Hughie Closs  Past Medical History  Diagnosis Date  . Osteoarthritis   . History of shingles     x 3   . Hypertension     STOPPED BP MED Apr 20 2015 - PCP IS AWARE  . Osteoporosis   . Incisional hernia    Past Surgical History  Procedure Laterality Date  . Cholecystectomy    . Incisional hernia repair N/A 04/27/2014    Procedure: Sherald Hess HERNIORRHAPHY WITH MESH;  Surgeon: Dalia Heading, MD;  Location: [facility];  Service: General;  Laterality: N/A;  . Insertion of mesh N/A 04/27/2014    Procedure: INSERTION OF MESH;  Surgeon: Dalia Heading, MD;  Location: [facility];  Service: General;  Laterality: N/A;  . Anterior lumbar fusion N/A 04/20/2015    Procedure: ANTERIOR LUMBAR FUSION Lumbar five-sacral one;  Surgeon: Venita Lick, MD;  Location: [facility];  Service: Orthopedics;  Laterality: N/A;  . Abdominal exposure N/A 04/20/2015    Procedure: ABDOMINAL EXPOSURE;  Surgeon: Chuck Hint, MD;  Location: [facility] OR;  Service: Vascular;  Laterality: N/A;  . Incisional hernia repair N/A 07/24/2015    Procedure: LAPAROSCOPIC VENTRAL HERNIA REPAIR WITH MESH;  Surgeon: Gaynelle Adu, MD;  Location: [facility];  Service: General;  Laterality: N/A;  . Insertion of mesh N/A  07/24/2015    Procedure: INSERTION OF MESH;  Surgeon: Gaynelle Adu, MD;  Location: [facility];  Service: General;  Laterality: N/A;   History reviewed. No pertinent family history. Social History  Substance Use Topics  . Smoking status: Former Smoker -- 0.25 packs/day for 30 years    Types: Cigarettes    Quit date: 02/13/2015  . Smokeless tobacco: Never Used  . Alcohol Use: No  on disability after back surgery No new sexual partners  OB History    No data available     Review of Systems  All other systems reviewed and are negative.     Allergies  Tetracyclines & related and Hydrocodone  Home Medications   Prior to Admission medications   Medication Sig Start Date End Date Taking? Authorizing Provider  acetaminophen (TYLENOL) 325 MG tablet Take 650 mg by mouth every 6 (six) hours as needed for mild pain or headache.    Historical Provider, MD  Ibuprofen-Diphenhydramine HCl (ADVIL PM) 200-25 MG CAPS Take 1 tablet by mouth at bedtime as needed (Sleep).    Historical Provider, MD  lisinopril (PRINIVIL,ZESTRIL) 10 MG tablet Take 10 mg by mouth daily as needed.     Historical Provider, MD  methocarbamol (ROBAXIN) 500 MG tablet Take 1 tablet (500 mg total) by mouth 3 (three) times daily as needed for muscle spasms. Patient not taking: Reported on 07/14/2015 04/20/15  Venita Lickahari Brooks, MD  ondansetron (ZOFRAN) 4 MG tablet Take 1 tablet (4 mg total) by mouth every 8 (eight) hours as needed for nausea or vomiting. 04/20/15   Venita Lickahari Brooks, MD  oxyCODONE (OXY IR/ROXICODONE) 5 MG immediate release tablet Take 1-2 tablets (5-10 mg total) by mouth every 4 (four) hours as needed for moderate pain. 07/25/15   Gaynelle AduEric Wilson, MD  predniSONE (DELTASONE) 20 MG tablet Take 3 po QD x 3d , then 2 po QD x 3d then 1 po QD x 3d 10/24/15   Devoria AlbeIva Analee Montee, MD  traMADol (ULTRAM) 50 MG tablet Take 50 mg by mouth every 6 (six) hours as needed (Pain).    Historical Provider, MD   BP 105/94 mmHg  Pulse 83  Temp(Src) 97.7 F  (36.5 C) (Oral)  Resp 20  Ht 5\' 4"  (1.626 m)  Wt 145 lb (65.772 kg)  BMI 24.88 kg/m2  SpO2 96%  Vital signs normal   Physical Exam  Constitutional: She is oriented to person, place, and time. She appears well-developed and well-nourished.  Non-toxic appearance. She does not appear ill. No distress.  HENT:  Head: Normocephalic and atraumatic.  Right Ear: External ear normal.  Left Ear: External ear normal.  Nose: Nose normal. No mucosal edema or rhinorrhea.  Mouth/Throat: Mucous membranes are normal. No dental abscesses or uvula swelling.  Eyes: Conjunctivae and EOM are normal.  Neck: Normal range of motion and full passive range of motion without pain.  Cardiovascular: Normal rate.  Exam reveals no gallop and no friction rub.   No murmur heard. Pulmonary/Chest: Effort normal. No respiratory distress. She has no rhonchi. She exhibits no crepitus.  Abdominal: Normal appearance.  Genitourinary:  Patient is noted to have diffuse redness with mild swelling of her labia without lesions. It does not extend into her thighs.  Musculoskeletal: Normal range of motion. She exhibits no edema or tenderness.  Moves all extremities well.   Neurological: She is alert and oriented to person, place, and time. She has normal strength. No cranial nerve deficit.  Skin: Skin is warm, dry and intact. No rash noted. No erythema. No pallor.  Psychiatric: She has a normal mood and affect. Her speech is normal and behavior is normal. Her mood appears not anxious.  Nursing note and vitals reviewed.   ED Course  Procedures (including critical care time)  Medications  dexamethasone (DECADRON) injection 10 mg (10 mg Intramuscular Given 10/24/15 0432)  diphenhydrAMINE (BENADRYL) capsule 50 mg (50 mg Oral Given 10/24/15 0432)  famotidine (PEPCID) tablet 20 mg (20 mg Oral Given 10/24/15 0433)    Labs Review Results for orders placed or performed during the hospital encounter of 10/24/15  CBG monitoring, ED   Result Value Ref Range   Glucose-Capillary 124 (H) 65 - 99 mg/dL     MDM   Final diagnoses:  Contact dermatitis  Rash of groin    New Prescriptions   PREDNISONE (DELTASONE) 20 MG TABLET    Take 3 po QD x 3d , then 2 po QD x 3d then 1 po QD x 3d    Plan discharge  Devoria AlbeIva Telia Amundson, MD, Concha PyoFACEP     Abdirahim Flavell, MD 10/24/15 [id number]

## 2015-10-24 NOTE — ED Notes (Signed)
Pt states she believes she had poison ivy last week and was seen and treated by her PCP. Pt c/o continued itching and swelling in between her legs and on her vagina.

## 2015-11-20 ENCOUNTER — Ambulatory Visit (INDEPENDENT_AMBULATORY_CARE_PROVIDER_SITE_OTHER): Payer: BLUE CROSS/BLUE SHIELD | Admitting: Obstetrics and Gynecology

## 2015-11-20 ENCOUNTER — Encounter: Payer: Self-pay | Admitting: Obstetrics and Gynecology

## 2015-11-20 ENCOUNTER — Other Ambulatory Visit (HOSPITAL_COMMUNITY)
Admission: RE | Admit: 2015-11-20 | Discharge: 2015-11-20 | Disposition: A | Discharge status: Home / Self Care | Payer: BLUE CROSS/BLUE SHIELD | Source: Ambulatory Visit | Attending: Obstetrics and Gynecology | Admitting: Obstetrics and Gynecology

## 2015-11-20 VITALS — BP 120/76 | Ht 64.0 in | Wt 175.0 lb

## 2015-11-20 DIAGNOSIS — S31831S Laceration without foreign body of anus, sequela: Secondary | ICD-10-CM

## 2015-11-20 DIAGNOSIS — Z124 Encounter for screening for malignant neoplasm of cervix: Secondary | ICD-10-CM | POA: Diagnosis not present

## 2015-11-20 DIAGNOSIS — Z01419 Encounter for gynecological examination (general) (routine) without abnormal findings: Secondary | ICD-10-CM | POA: Insufficient documentation

## 2015-11-20 DIAGNOSIS — A5901 Trichomonal vulvovaginitis: Secondary | ICD-10-CM | POA: Diagnosis not present

## 2015-11-20 DIAGNOSIS — Z1151 Encounter for screening for human papillomavirus (HPV): Secondary | ICD-10-CM | POA: Insufficient documentation

## 2015-11-20 MED ORDER — METRONIDAZOLE 500 MG PO TABS: 500.0000 mg | ORAL_TABLET | Freq: Two times a day (BID) | ORAL | Status: DC

## 2015-11-20 NOTE — Progress Notes (Signed)
Patient ID: Jocelyn [name], female   DOB: 02-23-1966, 50 y.o.   MRN: [id number] Pt here today for vaginal itching and burning. Pt states that she has had the symptoms for about 3 weeks. Pt states that she has been given medicated creams and OTC meds but has had no relief. Pt states that she has urinary frequency as well.

## 2015-11-20 NOTE — Addendum Note (Signed)
Addended by: Richardson ChiquitoRAVIS, ASHLEY M on: 11/20/2015 05:25 PM   Modules accepted: Orders

## 2015-11-20 NOTE — Progress Notes (Signed)
Patient ID: Jocelyn [name], female   DOB: 1965-06-21, 50 y.o.   MRN: [id number]    [facility] Clinic Visit  @            Patient name: Jocelyn [name] MRN [id number]  Date of birth: Sep 23, 1965  CC & HPI:  [name] is a 50 y.o. female presenting today for moderate, internal and external burning vaginal pain onset 4 weeks ago with associated internal and external vaginal itching, vaginal dryness and vaginal bleeding. Pt was seen in the ED on 10/24/15 for initial evaluation and was treated with prednisone for contact dermatitis of the groin. She also states that she has been given 4 different topical ointments by her PCP, Dr. Margo Aye, for the symptoms as well. Pt states that none of these treatments provided any relief. Pt states she has not gotten any new panties recently. She states she has used Gold Bond Lidocaine and Gold Bond with zinc oxide with little to no relief. She states she has not had a period in a year. Pt states that she began to have vasomotor symptoms at age 25, but they have currently resolved. She also states that she began to have periods q3-4 months at age 50. Pt states she is currently sexually active with a partner who she has been with for 5 years. Pt states she has not gotten a pap smear in over 20 years. No h/o abdominal surgeries. Pt states she has no difficulty with regular BMs, but loses control with diarrhea.   ROS:  Review of Systems  Genitourinary:       +vaginal burning, vaginal itching, vaginal bleeding  All other systems reviewed and are negative.  Pertinent History Reviewed:   Reviewed Medical         Past Medical History  Diagnosis Date  . Osteoarthritis   . History of shingles     x 3   . Hypertension     STOPPED BP MED Apr 20 2015 - PCP IS AWARE  . Osteoporosis   . Incisional hernia                               Surgical Hx:    Past Surgical History  Procedure Laterality Date  . Cholecystectomy    . Incisional hernia repair N/A 04/27/2014     Procedure: Sherald Hess HERNIORRHAPHY WITH MESH;  Surgeon: Dalia Heading, MD;  Location: [facility];  Service: General;  Laterality: N/A;  . Insertion of mesh N/A 04/27/2014    Procedure: INSERTION OF MESH;  Surgeon: Dalia Heading, MD;  Location: [facility];  Service: General;  Laterality: N/A;  . Anterior lumbar fusion N/A 04/20/2015    Procedure: ANTERIOR LUMBAR FUSION Lumbar five-sacral one;  Surgeon: Venita Lick, MD;  Location: [facility] OR;  Service: Orthopedics;  Laterality: N/A;  . Abdominal exposure N/A 04/20/2015    Procedure: ABDOMINAL EXPOSURE;  Surgeon: Chuck Hint, MD;  Location: [facility] OR;  Service: Vascular;  Laterality: N/A;  . Incisional hernia repair N/A 07/24/2015    Procedure: LAPAROSCOPIC VENTRAL HERNIA REPAIR WITH MESH;  Surgeon: Gaynelle Adu, MD;  Location: [facility];  Service: General;  Laterality: N/A;  . Insertion of mesh N/A 07/24/2015    Procedure: INSERTION OF MESH;  Surgeon: Gaynelle Adu, MD;  Location: [facility];  Service: General;  Laterality: N/A;   Medications: Reviewed & Updated - see associated section  Current outpatient prescriptions:  .  acetaminophen (TYLENOL) 325 MG tablet, Take 650 mg by mouth every 6 (six) hours as needed for mild pain or headache., Disp: , Rfl:  .  clonazePAM (KLONOPIN) 0.5 MG tablet, TK 1 T PO BID PRA, Disp: , Rfl: 2 .  escitalopram (LEXAPRO) 10 MG tablet, TK 1 T PO QPM, Disp: , Rfl: 5 .  eszopiclone (LUNESTA) 2 MG TABS tablet, Take 2 mg by mouth at bedtime as needed for sleep. Take immediately before bedtime, Disp: , Rfl:  .  meloxicam (MOBIC) 7.5 MG tablet, Take 7.5 mg by mouth daily., Disp: , Rfl:  .  ondansetron (ZOFRAN) 4 MG tablet, Take 1 tablet (4 mg total) by mouth every 8 (eight) hours as needed for nausea or vomiting. (Patient not taking: Reported on 11/20/2015), Disp: 20 tablet, Rfl: 0   Social History: Reviewed -  reports that she quit smoking about 9 months ago. Her smoking use included Cigarettes. She has a 7.5  pack-year smoking history. She has never used smokeless tobacco.  Objective Findings:  Vitals: Blood pressure 120/76, height 5\' 4"  (1.626 m), weight 175 lb (79.379 kg).  Physical Examination: General appearance - alert, well appearing, and in no distress Abdomen - soft, nontender, nondistended, no masses or organomegaly Pelvic -  EXTERNAL- Symmetric erythema surrounding the anus, extending up into the inguinal creases; 4th degree healed anal sphincter laceration with anal sphincter separation.  VULVA: normal appearing vulva with no masses, tenderness or lesions,  VAGINA: no lesions, slightly erythematous, generous vaginal light secretions present  CERVIX: normal appearing cervix without discharge or lesions,  UTERUS: uterus is normal size, shape, consistency and nontender, tiny, mobile  ADNEXA: normal adnexa in size, nontender and no masses Musculoskeletal - no joint tenderness, deformity or swelling RV NOT DONE,BUT PT able to tighten anal sphincter, and retraction sites seen at 10 and 2 oclock around anus, at tips of disrupted anal sphincter. Extremities - peripheral pulses normal, no pedal edema, no clubbing or cyanosis Skin - normal coloration and turgor, no rashes, no suspicious skin lesions noted  Discussed with pt 4th degree sphincter laceration repair in the future. At end of discussion, pt had opportunity to ask questions and has no further questions at this time.   Greater than 50% was spent in counseling and coordination of care with the patient. Total time greater than: 15 minutes   Assessment & Plan:   A: 1 TRICHOMONAS vulvovaginitis, 1. Vulvar, vaginal burning and itching DUE TO chronic vag d/c 2. Wet prep and KOH collected + TRICHOMONAS 3. 4th degree healed anal sphincter laceration with anal sphincter separation.  4. Vulvar irritation secondary to trichomonas vaginitis   P:  1. Pap smear collected today  2. Flagil x 1wk with partner treatment  3. F/u in 4 weeks for  recheck, discuss anal sphincter repair.  infoSheet given to the patient for herself and for her partner   By signing my name below, I, [name], attest that this documentation has been prepared under the direction and in the presence of Tilda BurrowJohn V Neila Teem, MD. Electronically Signed: [name], ED Scribe. 11/20/2015. 12:28 PM.  I personally performed the services described in this documentation, which was SCRIBED in my presence. The recorded information has been reviewed and considered accurate. It has been edited as necessary during review. Tilda BurrowFERGUSON,Shukri Nistler V, MD

## 2015-11-22 LAB — CYTOLOGY - PAP

## 2015-11-29 ENCOUNTER — Telehealth: Payer: Self-pay | Admitting: *Deleted

## 2015-11-29 NOTE — Telephone Encounter (Signed)
Pt called stating that she is still having itching. I spoke with Dr.Ferguson and he advised that she needed to get some 1% hydrocortisone cream and make an earlier appointment.   I advised the pt of all the above and she verbalized understanding. Phone call was switched to front office and appointment was made for the beginning of next week.

## 2015-12-04 ENCOUNTER — Encounter: Payer: Self-pay | Admitting: Obstetrics and Gynecology

## 2015-12-04 ENCOUNTER — Ambulatory Visit (INDEPENDENT_AMBULATORY_CARE_PROVIDER_SITE_OTHER): Payer: BLUE CROSS/BLUE SHIELD | Admitting: Obstetrics and Gynecology

## 2015-12-04 VITALS — BP 120/80 | Ht 64.0 in | Wt 176.0 lb

## 2015-12-04 DIAGNOSIS — R159 Full incontinence of feces: Secondary | ICD-10-CM | POA: Diagnosis not present

## 2015-12-04 DIAGNOSIS — N898 Other specified noninflammatory disorders of vagina: Secondary | ICD-10-CM

## 2015-12-04 DIAGNOSIS — A5901 Trichomonal vulvovaginitis: Secondary | ICD-10-CM | POA: Diagnosis not present

## 2015-12-04 DIAGNOSIS — L298 Other pruritus: Secondary | ICD-10-CM

## 2015-12-04 MED ORDER — FLUCONAZOLE 150 MG PO TABS: 150.0000 mg | ORAL_TABLET | ORAL | 1 refills | Status: DC

## 2015-12-04 NOTE — Progress Notes (Signed)
Patient ID: Jeannine Boga, female   DOB: 02-Apr-1966, 50 y.o.   MRN: [id number]    [facility] Clinic Visit  @            Patient name: Jocelyn [name] MRN [id number]  Date of birth: August 13, 1965  CC & HPI:  Jocelyn [name] is a 50 y.o. female presenting today for f/u of treatment for trichomonas vulvovaginitis on 11/20/15. Pt was treated with 1 week of Flagil on this visit. Pt states she completed the course of Flagil and this treatment has provided some relief; however she continues to experience vaginal itching. She reports the hydrocortisone provided no relief but she switched to Gold Bond lidocaine and this provided some relief. She also complains of persistent vaginal dryness.   She also complains of urinary hesitancy and frequency.   She denies bowel incontinence.   ROS:  Review of Systems  Genitourinary: Positive for frequency.       +urinary hesitancy, vaginal itching, vaginal dryness -bowel incontinence      Pertinent History Reviewed:   Reviewed Medical         Past Medical History:  Diagnosis Date  . History of shingles    x 3   . Hypertension    STOPPED BP MED Apr 20 2015 - PCP IS AWARE  . Incisional hernia   . Osteoarthritis   . Osteoporosis                               Surgical Hx:    Past Surgical History:  Procedure Laterality Date  . ABDOMINAL EXPOSURE N/A 04/20/2015   Procedure: ABDOMINAL EXPOSURE;  Surgeon: Chuck Hint, MD;  Location: [facility] OR;  Service: Vascular;  Laterality: N/A;  . ANTERIOR LUMBAR FUSION N/A 04/20/2015   Procedure: ANTERIOR LUMBAR FUSION Lumbar five-sacral one;  Surgeon: Venita Lick, MD;  Location: [facility];  Service: Orthopedics;  Laterality: N/A;  . CHOLECYSTECTOMY    . INCISIONAL HERNIA REPAIR N/A 04/27/2014   Procedure: Sherald Hess HERNIORRHAPHY WITH MESH;  Surgeon: Dalia Heading, MD;  Location: [facility];  Service: General;  Laterality: N/A;  . INCISIONAL HERNIA REPAIR N/A 07/24/2015   Procedure: LAPAROSCOPIC VENTRAL  HERNIA REPAIR WITH MESH;  Surgeon: Gaynelle Adu, MD;  Location: [facility];  Service: General;  Laterality: N/A;  . INSERTION OF MESH N/A 04/27/2014   Procedure: INSERTION OF MESH;  Surgeon: Dalia Heading, MD;  Location: [facility];  Service: General;  Laterality: N/A;  . INSERTION OF MESH N/A 07/24/2015   Procedure: INSERTION OF MESH;  Surgeon: Gaynelle Adu, MD;  Location: [facility];  Service: General;  Laterality: N/A;   Medications: Reviewed & Updated - see associated section                       Current Outpatient Prescriptions:  .  acetaminophen (TYLENOL) 325 MG tablet, Take 650 mg by mouth every 6 (six) hours as needed for mild pain or headache., Disp: , Rfl:  .  clonazePAM (KLONOPIN) 0.5 MG tablet, TK 1 T PO BID PRA, Disp: , Rfl: 2 .  escitalopram (LEXAPRO) 10 MG tablet, TK 1 T PO QPM, Disp: , Rfl: 5 .  eszopiclone (LUNESTA) 2 MG TABS tablet, Take 2 mg by mouth at bedtime as needed for sleep. Take immediately before bedtime, Disp: , Rfl:  .  ondansetron (ZOFRAN) 4 MG tablet, Take 1 tablet (4 mg total) by  mouth every 8 (eight) hours as needed for nausea or vomiting. (Patient not taking: Reported on 11/20/2015), Disp: 20 tablet, Rfl: 0   Social History: Reviewed -  reports that she quit smoking about 9 months ago. Her smoking use included Cigarettes. She has a 7.50 pack-year smoking history. She has never used smokeless tobacco.  Objective Findings:  Vitals: Blood pressure 120/80, height 5\' 4"  (1.626 m), weight 176 lb (79.8 kg).  Physical Examination: General appearance - alert, well appearing, and in no distress Mental status - alert, oriented to person, place, and time Abdomen - soft, nontender, nondistended, no masses or organomegaly Pelvic -  VULVA: Area of skin erythema "kissing lesions" involving the labia minora with slight redness, consistent with chronic moisture changes  VAGINA: normal appearing vagina with normal color and discharge, no lesions,  CERVIX: normal appearing cervix without  discharge or lesions,  UTERUS: uterus is normal size, shape, consistency and nontender,  ADNEXA: normal adnexa in size, nontender and no masses RECTAL: 4th degree anal sphincter disruption. PT able to tighten anal sphincter, and retraction sites seen at 10 and 2 oclock around anus, at tips of disrupted anal sphincter Musculoskeletal - no joint tenderness, deformity or swelling Extremities - peripheral pulses normal, no pedal edema, no clubbing or cyanosis Skin - normal coloration and turgor, no rashes, no suspicious skin lesions noted   Discussed with pt 4th degree sphincter laceration repair in the future.  Advised pt that 10% weight loss will reduce moisture trapping and chronic vulvar irritation. Discussed dry bottom regimen with pt to alleviate chronic moisture changes. Also advised pt to continue hydrocortisone use.   At end of discussion, pt had opportunity to ask questions and has no further questions at this time.   Greater than 50% was spent in counseling and coordination of care with the patient. Total time greater than: 25 minutes  Assessment & Plan:   A:  1. Persistent vaginal irritation s/p treatment with 1 wk Flagil for Trichomonas Vulvovaginitis  2. Chronic vaginal discharge and moisture changes  3. 4th degree healed anal sphincter laceration with anal sphincter separation.  4. Wet prep and KOH collected - negative for bacteria, yeast and trichomonas   P:  1. Continue hydrocortisone use, rx weekly Diflucan x 6 weeks  2. Discussed benefits of 10% weight loss and dry bottom regimen to alleviate chronic moisture changes  3. Pt provided with handout and information on 4th degree laceration and anal sphincter disruption 4. F/u for recheck in 6 weeks or sooner if needed     By signing my name below, I, Doreatha Martin, attest that this documentation has been prepared under the direction and in the presence of Tilda Burrow, MD. Electronically Signed: Doreatha Martin, ED Scribe.  12/04/15. 10:19 AM.  I personally performed the services described in this documentation, which was SCRIBED in my presence. The recorded information has been reviewed and considered accurate. It has been edited as necessary during review. Tilda Burrow, MD

## 2015-12-18 ENCOUNTER — Ambulatory Visit (INDEPENDENT_AMBULATORY_CARE_PROVIDER_SITE_OTHER): Payer: BLUE CROSS/BLUE SHIELD | Admitting: Obstetrics and Gynecology

## 2015-12-18 ENCOUNTER — Encounter: Payer: Self-pay | Admitting: Obstetrics and Gynecology

## 2015-12-18 DIAGNOSIS — A5901 Trichomonal vulvovaginitis: Secondary | ICD-10-CM | POA: Diagnosis not present

## 2015-12-18 DIAGNOSIS — K6281 Anal sphincter tear (healed) (nontraumatic) (old): Secondary | ICD-10-CM | POA: Diagnosis not present

## 2015-12-18 NOTE — Progress Notes (Addendum)
[facility] Visit  12/18/15           Patient name: Jocelyn [name] MRN [id number]  Date of birth: 1965/07/29  CC & HPI:  Jocelyn [name] is a 50 y.o. female presenting today for follow up. She reports continued vaginal itching that presents during the evening/night time; uses [place] lidocaine with some relief. She reports desire to surgically fix the anal sphincter damage she has from 20+ years ago but states it is not the best time right now.   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 History of shingles    x 3   . Hypertension    STOPPED BP MED Apr 20 2015 - PCP IS AWARE  . Incisional hernia   . Osteoarthritis   . Osteoporosis                               Surgical Hx:    Past Surgical History:  Procedure Laterality Date  . ABDOMINAL EXPOSURE N/A 04/20/2015   Procedure: ABDOMINAL EXPOSURE;  Surgeon: Chuck Hinthristopher S Dickson, MD;  [facility];  Service: Vascular;  Laterality: N/A;  . ANTERIOR LUMBAR FUSION N/A 04/20/2015   Procedure: ANTERIOR LUMBAR FUSION Lumbar five-sacral one;  Surgeon: Venita Lickahari Brooks, MD;  Location: [facility] OR;  Service: Orthopedics;  Laterality: N/A;  . CHOLECYSTECTOMY    . INCISIONAL HERNIA REPAIR N/A 04/27/2014   Procedure: Sherald HessINCISIONAL HERNIORRHAPHY WITH MESH;  Surgeon: Dalia HeadingMark A Jenkins, MD;  Location: [facility] ORS;  Service: General;  Laterality: N/A;  . INCISIONAL HERNIA REPAIR N/A 07/24/2015   Procedure: LAPAROSCOPIC VENTRAL HERNIA REPAIR WITH MESH;  Surgeon: Gaynelle AduEric Wilson, MD;  Location: [facility] ORS;  Service: General;  Laterality: N/A;  . INSERTION OF MESH N/A 04/27/2014   Procedure: INSERTION OF MESH;  Surgeon: Dalia HeadingMark A Jenkins, MD;  Location: [facility] ORS;  Service: General;  Laterality: N/A;  . INSERTION OF MESH N/A 07/24/2015   Procedure: INSERTION OF MESH;  Surgeon: Gaynelle AduEric Wilson,  MD;  Location: [facility] ORS;  Service: General;  Laterality: N/A;   Medications: Reviewed & Updated - see associated section                       Current Outpatient Prescriptions:  .  acetaminophen (TYLENOL) 325 MG tablet, Take 650 mg by mouth every 6 (six) hours as needed for mild pain or headache., Disp: , Rfl:  .  clonazePAM (KLONOPIN) 0.5 MG tablet, TK 1 T PO BID PRA, Disp: , Rfl: 2 .  escitalopram (LEXAPRO) 10 MG tablet, TK 1 T PO QPM, Disp: , Rfl: 5 .  fluconazole (DIFLUCAN) 150 MG tablet, Take 1 tablet (150 mg total) by mouth once a week., Disp: 6 tablet, Rfl: 1 .  zolpidem (AMBIEN) 5 MG tablet, TK 1 T PO QD HS PRF SLEEP, Disp: , Rfl: 0 .  ondansetron (ZOFRAN) 4 MG tablet, Take 1 tablet (4 mg total) by mouth every 8 (eight) hours as needed for nausea or vomiting. (Patient not taking: Reported on 11/20/2015), Disp: 20 tablet, Rfl: 0   Social History: Reviewed -  reports that she quit smoking about 10 months ago. Her smoking use included Cigarettes. She has a 7.50  pack-year smoking history. She has never used smokeless tobacco.  Objective Findings:  Vitals: Blood pressure 110/72, height 5\' 4"  (1.626 m), weight 176 lb 8 oz (80.1 kg).  Physical Examination: General appearance - alert, well appearing, and in no distress and oriented to person, place, and time Mental status - alert, oriented to person, place, and time, normal mood, behavior, speech, dress, motor activity, and thought processes Pelvic - examination not indicated, discussion only    Assessment & Plan:   A:  1. Anal sphincter disruption, stable.       Family/situational stressors, not good time for surgery 2. Trichomonas  vulvovaginitis, resolved   P:  1. Patient will contact us for when she wants to consider surgery for sphincter repair 2. Follow up PRN 3 pt to stop weekly diflucan   By signing my name below, I, Sonum Patel, attest that this documentation has been prepared under the direction and in the presence of Tilda BurrowJohn V  Jaylenne Hamelin, MD. Electronically Signed: Sonum Patel, Neurosurgeoncribe. 12/18/15. 11:31 AM.  I personally performed the services described in this documentation, which was SCRIBED in my presence. The recorded information has been reviewed and considered accurate. It has been edited as necessary during review. Tilda BurrowFERGUSON,Pegge Cumberledge V, MD

## 2015-12-23 ENCOUNTER — Encounter (HOSPITAL_COMMUNITY): Payer: Self-pay

## 2015-12-23 DIAGNOSIS — E860 Dehydration: Secondary | ICD-10-CM | POA: Insufficient documentation

## 2015-12-23 DIAGNOSIS — Z87891 Personal history of nicotine dependence: Secondary | ICD-10-CM | POA: Insufficient documentation

## 2015-12-23 DIAGNOSIS — R1084 Generalized abdominal pain: Secondary | ICD-10-CM | POA: Diagnosis present

## 2015-12-23 DIAGNOSIS — R51 Headache: Secondary | ICD-10-CM | POA: Diagnosis not present

## 2015-12-23 DIAGNOSIS — I10 Essential (primary) hypertension: Secondary | ICD-10-CM | POA: Insufficient documentation

## 2015-12-23 DIAGNOSIS — Z79899 Other long term (current) drug therapy: Secondary | ICD-10-CM | POA: Diagnosis not present

## 2015-12-23 LAB — URINALYSIS, ROUTINE W REFLEX MICROSCOPIC
BILIRUBIN URINE: NEGATIVE
Glucose, UA: NEGATIVE mg/dL
Leukocytes, UA: NEGATIVE
NITRITE: NEGATIVE
PROTEIN: 30 mg/dL — AB
SPECIFIC GRAVITY, URINE: 1.025 (ref 1.005–1.030)
pH: 5 (ref 5.0–8.0)

## 2015-12-23 LAB — URINE MICROSCOPIC-ADD ON

## 2015-12-23 NOTE — ED Triage Notes (Signed)
Pt has had nausea, cold sweat, diarrhea, abd cramping for a couple of days, worse tonight after eating chicken soup

## 2015-12-24 ENCOUNTER — Emergency Department (HOSPITAL_COMMUNITY)
Admission: EM | Admit: 2015-12-24 | Discharge: 2015-12-24 | Disposition: A | Discharge status: Home / Self Care | Payer: BLUE CROSS/BLUE SHIELD | Attending: Emergency Medicine | Admitting: Emergency Medicine

## 2015-12-24 DIAGNOSIS — E860 Dehydration: Secondary | ICD-10-CM

## 2015-12-24 DIAGNOSIS — R197 Diarrhea, unspecified: Secondary | ICD-10-CM

## 2015-12-24 DIAGNOSIS — R112 Nausea with vomiting, unspecified: Secondary | ICD-10-CM

## 2015-12-24 LAB — CBC
HEMATOCRIT: 46.3 % — AB (ref 36.0–46.0)
Hemoglobin: 16.1 g/dL — ABNORMAL HIGH (ref 12.0–15.0)
MCH: 32.6 pg (ref 26.0–34.0)
MCHC: 34.8 g/dL (ref 30.0–36.0)
MCV: 93.7 fL (ref 78.0–100.0)
PLATELETS: 287 10*3/uL (ref 150–400)
RBC: 4.94 MIL/uL (ref 3.87–5.11)
RDW: 12.9 % (ref 11.5–15.5)
WBC: 10.4 10*3/uL (ref 4.0–10.5)

## 2015-12-24 LAB — COMPREHENSIVE METABOLIC PANEL
ALBUMIN: 4.2 g/dL (ref 3.5–5.0)
ALT: 46 U/L (ref 14–54)
AST: 103 U/L — AB (ref 15–41)
Alkaline Phosphatase: 120 U/L (ref 38–126)
Anion gap: 6 (ref 5–15)
BUN: 14 mg/dL (ref 6–20)
CHLORIDE: 104 mmol/L (ref 101–111)
CO2: 23 mmol/L (ref 22–32)
CREATININE: 1.24 mg/dL — AB (ref 0.44–1.00)
Calcium: 8.5 mg/dL — ABNORMAL LOW (ref 8.9–10.3)
GFR calc Af Amer: 58 mL/min — ABNORMAL LOW (ref >60)
GFR calc non Af Amer: 50 mL/min — ABNORMAL LOW (ref >60)
Glucose, Bld: 112 mg/dL — ABNORMAL HIGH (ref 65–99)
POTASSIUM: 3.4 mmol/L — AB (ref 3.5–5.1)
SODIUM: 133 mmol/L — AB (ref 135–145)
Total Bilirubin: 0.5 mg/dL (ref 0.3–1.2)
Total Protein: 7.9 g/dL (ref 6.5–8.1)

## 2015-12-24 LAB — LIPASE, BLOOD: LIPASE: 18 U/L (ref 11–51)

## 2015-12-24 MED ORDER — SODIUM CHLORIDE 0.9 % IV BOLUS (SEPSIS)
1000.0000 mL | Freq: Once | INTRAVENOUS | Status: AC
  Administered 2015-12-24: 1000 mL via INTRAVENOUS

## 2015-12-24 MED ORDER — ONDANSETRON 8 MG PO TBDP: 8.0000 mg | ORAL_TABLET | Freq: Three times a day (TID) | ORAL | 0 refills | Status: DC | PRN

## 2015-12-24 MED ORDER — FENTANYL CITRATE (PF) 100 MCG/2ML IJ SOLN
50.0000 ug | Freq: Once | INTRAMUSCULAR | Status: AC
  Administered 2015-12-24: 50 ug via INTRAVENOUS
  Filled 2015-12-24: qty 2

## 2015-12-24 MED ORDER — ONDANSETRON HCL 4 MG/2ML IJ SOLN
4.0000 mg | Freq: Once | INTRAMUSCULAR | Status: AC
  Administered 2015-12-24: 4 mg via INTRAVENOUS
  Filled 2015-12-24: qty 2

## 2015-12-24 NOTE — Discharge Instructions (Signed)

## 2015-12-24 NOTE — ED Provider Notes (Signed)
[facility] Provider Note   CSN: [id number] Arrival date & time: 12/23/15  2140  By signing my name below, I, Jocelyn [name], attest that this documentation has been prepared under the direction and in the presence of physician practitioner, Zadie Rhineonald Talecia Sherlin, MD. Electronically Signed: Linna Darnerussell [name], Scribe. 12/24/2015. 12:50 AM.  History   Chief Complaint Chief Complaint  Patient presents with  . Abdominal Pain    The history is provided by the patient. No language interpreter was used.  Abdominal Pain   This is a new problem. The current episode started more than 2 days ago. The problem occurs constantly. The problem has not changed since onset.The pain is located in the generalized abdominal region. The quality of the pain is cramping. The pain is severe. Associated symptoms include diarrhea, nausea, vomiting and headaches. Pertinent negatives include fever and hematochezia. The symptoms are aggravated by eating.     HPI Comments: Jocelyn [name] A Mantell is a 50 y.o. female who presents to the Emergency Department complaining of sudden onset, constant, severe, cramping, generalized abdominal pain beginning three days ago. Pt endorses associated nausea, vomiting, diarrhea, headache, cold sweats, and mild cough. Pt states her abdominal pain presented after vomiting three days ago. She states she ate a pimento cheese sandwich yesterday as well as some chicken noodle soup, but had not eaten for 2 days prior. She states her abdominal pain worsened after eating yesterday. Pt was treated with antibiotics a couple of weeks ago for a yeast infection. She notes an abdominal hernia repair surgery in March of this year. She denies sick contacts with similar symptoms. She denies recent travel or camping. Pt further denies fever, hematochezia, or any other associated symptoms.  Past Medical History:  Diagnosis Date  . History of shingles    x 3   . Hypertension    STOPPED BP MED Apr 20 2015 - PCP IS  AWARE  . Incisional hernia   . Osteoarthritis   . Osteoporosis     Patient Active Problem List   Diagnosis Date Noted  . 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 Trichomonal vulvovaginitis 12/18/2015  . Ventral hernia 07/24/2015  . Back pain 04/20/2015    Past Surgical History:  Procedure Laterality Date  . ABDOMINAL EXPOSURE N/A 04/20/2015   Procedure: ABDOMINAL EXPOSURE;  Surgeon: Chuck Hinthristopher S Dickson, MD;  [facility];  Service: Vascular;  Laterality: N/A;  . ANTERIOR LUMBAR FUSION N/A 04/20/2015   Procedure: ANTERIOR LUMBAR FUSION Lumbar five-sacral one;  Surgeon: Venita Lickahari Brooks, MD;  Location: [facility] OR;  Service: Orthopedics;  Laterality: N/A;  . CHOLECYSTECTOMY    . INCISIONAL HERNIA REPAIR N/A 04/27/2014   Procedure: Sherald HessINCISIONAL HERNIORRHAPHY WITH MESH;  Surgeon: Dalia HeadingMark A Jenkins, MD;  Location: [facility] ORS;  Service: General;  Laterality: N/A;  . INCISIONAL HERNIA REPAIR N/A 07/24/2015   Procedure: LAPAROSCOPIC VENTRAL HERNIA REPAIR WITH MESH;  Surgeon: Gaynelle AduEric Wilson, MD;  Location: [facility] ORS;  Service: General;  Laterality: N/A;  . INSERTION OF MESH N/A 04/27/2014   Procedure: INSERTION OF MESH;  Surgeon: Dalia HeadingMark A Jenkins, MD;  Location: [facility] ORS;  Service: General;  Laterality: N/A;  . INSERTION OF MESH N/A 07/24/2015   Procedure: INSERTION OF MESH;  Surgeon: Gaynelle AduEric Wilson, MD;  Location: [facility] ORS;  Service: General;  Laterality: N/A;    OB History    No data available       Home Medications    Prior to Admission medications   Medication Sig Start Date End Date  Taking? Authorizing Provider  acetaminophen (TYLENOL) 325 MG tablet Take 650 mg by mouth every 6 (six) hours as needed for mild pain or headache.    Historical Provider, MD  clonazePAM (KLONOPIN) 0.5 MG tablet TK 1 T PO BID PRA 11/15/15   Historical Provider, MD  escitalopram (LEXAPRO) 10 MG tablet TK 1 T PO QPM 11/02/15   Historical Provider, MD  fluconazole (DIFLUCAN) 150 MG tablet Take 1 tablet (150 mg total) by mouth once a  week. 12/04/15   Almon Hercules, MD  ondansetron (ZOFRAN) 4 MG tablet Take 1 tablet (4 mg total) by mouth every 8 (eight) hours as needed for nausea or vomiting. Patient not taking: Reported on 11/20/2015 04/20/15   Venita Lick, MD  zolpidem (AMBIEN) 5 MG tablet TK 1 T PO QD HS PRF SLEEP 12/08/15   Historical Provider, MD    Family History No family history on file.  Social History Social History  Substance Use Topics  . Smoking status: Former Smoker    Packs/day: 0.25    Years: 30.00    Types: Cigarettes    Quit date: 02/13/2015  . Smokeless tobacco: Never Used  . Alcohol use No     Allergies   Tetracyclines & related and Hydrocodone   Review of Systems Review of Systems  Constitutional: Negative for fever.  Gastrointestinal: Positive for abdominal pain, diarrhea, nausea and vomiting. Negative for blood in stool and hematochezia.  Neurological: Positive for headaches.  All other systems reviewed and are negative.   Physical Exam Updated Vital Signs BP 116/71 (BP Location: Left Arm)   Pulse 81   Temp 97.7 F (36.5 C)   Resp 20   Ht 5\' 4"  (1.626 m)   Wt 170 lb (77.1 kg)   SpO2 100%   BMI 29.18 kg/m   Physical Exam  CONSTITUTIONAL: Well developed/well nourished HEAD: Normocephalic/atraumatic EYES: EOMI/PERRL, no icterus ENMT: Mucous membranes dry NECK: supple no meningeal signs SPINE/BACK:entire spine nontender CV: S1/S2 noted, no murmurs/rubs/gallops noted LUNGS: Lungs are clear to auscultation bilaterally, no apparent distress ABDOMEN: soft, nontender, no rebound or guarding, bowel sounds noted throughout abdomen GU:no cva tenderness NEURO: Pt is awake/alert/appropriate, moves all extremitiesx4.  No facial droop.   EXTREMITIES: pulses normal/equal, full ROM SKIN: warm, color normal PSYCH: no abnormalities of mood noted, alert and oriented to situation  ED Treatments / Results  Labs (all labs ordered are listed, but only abnormal results are  displayed) Labs Reviewed  COMPREHENSIVE METABOLIC PANEL - Abnormal; Notable for the following:       Result Value   Sodium 133 (*)    Potassium 3.4 (*)    Glucose, Bld 112 (*)    Creatinine, Ser 1.24 (*)    Calcium 8.5 (*)    AST 103 (*)    GFR calc non Af Amer 50 (*)    GFR calc Af Amer 58 (*)    All other components within normal limits  CBC - Abnormal; Notable for the following:    Hemoglobin 16.1 (*)    HCT 46.3 (*)    All other components within normal limits  URINALYSIS, ROUTINE W REFLEX MICROSCOPIC (NOT AT [facility]) - Abnormal; Notable for the following:    Hgb urine dipstick SMALL (*)    Ketones, ur TRACE (*)    Protein, ur 30 (*)    All other components within normal limits  URINE MICROSCOPIC-ADD ON - Abnormal; Notable for the following:    Squamous Epithelial / LPF 6-30 (*)  Bacteria, UA FEW (*)    All other components within normal limits  LIPASE, BLOOD    EKG  EKG Interpretation None       Radiology No results found.  Procedures Procedures (including critical care time)  DIAGNOSTIC STUDIES: Oxygen Saturation is 100% on RA, normal by my interpretation.    COORDINATION OF CARE: 12:58 AM Discussed treatment plan with pt at bedside and pt agreed to plan.  Medications Ordered in ED Medications  sodium chloride 0.9 % bolus 1,000 mL (1,000 mLs Intravenous New Bag/Given 12/24/15 0114)  ondansetron (ZOFRAN) injection 4 mg (4 mg Intravenous Given 12/24/15 0114)  fentaNYL (SUBLIMAZE) injection 50 mcg (50 mcg Intravenous Given 12/24/15 0114)     Initial Impression / Assessment and Plan / ED Course  I have reviewed the triage vital signs and the nursing notes.  Pertinent labs results that were available during my care of the patient were reviewed by me and considered in my medical decision making (see chart for details).  Clinical Course    Pt improved Taking PO fluids abd soft Suspect viral illness Will d/c home We discussed strict return  precautions   I personally performed the services described in this documentation, which was scribed in my presence. The recorded information has been reviewed and is accurate.      Final Clinical Impressions(s) / ED Diagnoses   Final diagnoses:  Non-intractable vomiting with nausea, vomiting of unspecified type  Diarrhea, unspecified type  Dehydration    New Prescriptions New Prescriptions   ONDANSETRON (ZOFRAN ODT) 8 MG DISINTEGRATING TABLET    Take 1 tablet (8 mg total) by mouth every 8 (eight) hours as needed.     Zadie Rhineonald Kahiau Schewe, MD 12/24/15 [phone]

## 2016-01-15 ENCOUNTER — Ambulatory Visit: Payer: BLUE CROSS/BLUE SHIELD | Admitting: Obstetrics and Gynecology

## 2016-05-23 ENCOUNTER — Emergency Department (HOSPITAL_COMMUNITY)
Admission: EM | Admit: 2016-05-23 | Discharge: 2016-05-24 | Disposition: A | Discharge status: Home / Self Care | Payer: BLUE CROSS/BLUE SHIELD | Attending: Emergency Medicine | Admitting: Emergency Medicine

## 2016-05-23 ENCOUNTER — Encounter (HOSPITAL_COMMUNITY): Payer: Self-pay | Admitting: Emergency Medicine

## 2016-05-23 DIAGNOSIS — F17210 Nicotine dependence, cigarettes, uncomplicated: Secondary | ICD-10-CM | POA: Diagnosis not present

## 2016-05-23 DIAGNOSIS — I10 Essential (primary) hypertension: Secondary | ICD-10-CM | POA: Insufficient documentation

## 2016-05-23 DIAGNOSIS — F410 Panic disorder [episodic paroxysmal anxiety] without agoraphobia: Secondary | ICD-10-CM

## 2016-05-23 DIAGNOSIS — Z79899 Other long term (current) drug therapy: Secondary | ICD-10-CM | POA: Insufficient documentation

## 2016-05-23 NOTE — ED Provider Notes (Signed)
[facility] Provider Note   CSN: [id number] Arrival date & time: 05/23/16  2143     History   Chief Complaint Chief Complaint  Patient presents with  . Panic Attack    HPI Jocelyn [name] is a 51 y.o. female.  Patient is a 51 year old female who presents to the emergency department with a complaint of panic and anxiety.  The patient states that she has been out of her Xanax and out of her Klonopin for about 2 or 3 weeks. She has been having an increased number of panic and anxiety related reactions over the last 4 days. She states that she gets episodes of feeling like she is short of breath and her heart beating very hard in her chest. She feels a sensation of doom. She denies suicidal or homicidal ideation or plan. She presents at this time for assistance with her medications. She is seen by Dr. Scharlene GlossHall's office, but states she has not been in to the office for several weeks.   The history is provided by the patient.    Past Medical History:  Diagnosis Date  . History of shingles    x 3   . Hypertension    STOPPED BP MED Apr 20 2015 - PCP IS AWARE  . Incisional hernia   . Osteoarthritis   . Osteoporosis     Patient Active Problem List   Diagnosis Date Noted  . 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 Trichomonal vulvovaginitis 12/18/2015  . Ventral hernia 07/24/2015  . Back pain 04/20/2015    Past Surgical History:  Procedure Laterality Date  . ABDOMINAL EXPOSURE N/A 04/20/2015   Procedure: ABDOMINAL EXPOSURE;  Surgeon: Chuck Hinthristopher S Dickson, MD;  [facility];  Service: Vascular;  Laterality: N/A;  . ANTERIOR LUMBAR FUSION N/A 04/20/2015   Procedure: ANTERIOR LUMBAR FUSION Lumbar five-sacral one;  Surgeon: Venita Lickahari Brooks, MD;  Location: [facility] OR;  Service: Orthopedics;  Laterality: N/A;  . BACK SURGERY    . CHOLECYSTECTOMY    . INCISIONAL HERNIA REPAIR N/A 04/27/2014   Procedure: Sherald HessINCISIONAL HERNIORRHAPHY WITH MESH;  Surgeon: Dalia HeadingMark A Jenkins, MD;  Location:  [facility] ORS;  Service: General;  Laterality: N/A;  . INCISIONAL HERNIA REPAIR N/A 07/24/2015   Procedure: LAPAROSCOPIC VENTRAL HERNIA REPAIR WITH MESH;  Surgeon: Gaynelle AduEric Wilson, MD;  Location: [facility] ORS;  Service: General;  Laterality: N/A;  . INSERTION OF MESH N/A 04/27/2014   Procedure: INSERTION OF MESH;  Surgeon: Dalia HeadingMark A Jenkins, MD;  Location: [facility] ORS;  Service: General;  Laterality: N/A;  . INSERTION OF MESH N/A 07/24/2015   Procedure: INSERTION OF MESH;  Surgeon: Gaynelle AduEric Wilson, MD;  Location: [facility] ORS;  Service: General;  Laterality: N/A;    OB History    No data available       Home Medications    Prior to Admission medications   Medication Sig Start Date End Date Taking? Authorizing Provider  acetaminophen (TYLENOL) 325 MG tablet Take 650 mg by mouth every 6 (six) hours as needed for mild pain or headache.   Yes Historical Provider, MD  ALPRAZolam Prudy Feeler(XANAX) 1 MG tablet Take 1 tablet by mouth 2 (two) times daily. 04/28/16  Yes Historical Provider, MD  clonazePAM (KLONOPIN) 0.5 MG tablet TK 1 T PO BID PRA 11/15/15  Yes Historical Provider, MD  escitalopram (LEXAPRO) 10 MG tablet take 1 tablet daily 11/02/15  Yes Historical Provider, MD    Family History History reviewed. No pertinent family history.  Social History Social  History  Substance Use Topics  . Smoking status: Current Every Day Smoker    Packs/day: 0.25    Years: 30.00    Types: Cigarettes    Last attempt to quit: 02/13/2015  . Smokeless tobacco: Never Used  . Alcohol use Yes     Allergies   Tetracyclines & related and Hydrocodone   Review of Systems Review of Systems  Constitutional: Negative for activity change.       All ROS Neg except as noted in HPI  HENT: Negative for nosebleeds.   Eyes: Negative for photophobia and discharge.  Respiratory: Negative for cough, shortness of breath and wheezing.   Cardiovascular: Negative for chest pain and palpitations.  Gastrointestinal: Negative for abdominal pain and blood in  stool.  Genitourinary: Negative for dysuria, frequency and hematuria.  Musculoskeletal: Negative for arthralgias, back pain and neck pain.  Skin: Negative.   Neurological: Negative for dizziness, seizures and speech difficulty.  Psychiatric/Behavioral: Negative for confusion and suicidal ideas. The patient is nervous/anxious.      Physical Exam Updated Vital Signs BP 153/91 (BP Location: Left Arm)   Pulse 102   Temp 97.8 F (36.6 C) (Oral)   Resp 24   Ht 5\' 4"  (1.626 m)   Wt 72.6 kg   SpO2 98%   BMI 27.46 kg/m   Physical Exam  Constitutional: She is oriented to person, place, and time. She appears well-developed and well-nourished.  Non-toxic appearance.  HENT:  Head: Normocephalic.  Right Ear: Tympanic membrane and external ear normal.  Left Ear: Tympanic membrane and external ear normal.  Eyes: EOM and lids are normal. Pupils are equal, round, and reactive to light.  Neck: Normal range of motion. Neck supple. Carotid bruit is not present.  Cardiovascular: Normal rate, regular rhythm, normal heart sounds, intact distal pulses and normal pulses.   Pulmonary/Chest: Breath sounds normal. No respiratory distress.  Abdominal: Soft. Bowel sounds are normal. There is no tenderness. There is no guarding.  Musculoskeletal: Normal range of motion.  Lymphadenopathy:       Head (right side): No submandibular adenopathy present.       Head (left side): No submandibular adenopathy present.    She has no cervical adenopathy.  Neurological: She is alert and oriented to person, place, and time. She has normal strength. No cranial nerve deficit or sensory deficit.  Skin: Skin is warm and dry.  Psychiatric: Her speech is normal and behavior is normal. Her mood appears anxious. Cognition and memory are normal.  Nursing note and vitals reviewed.    ED Treatments / Results  Labs (all labs ordered are listed, but only abnormal results are displayed) Labs Reviewed - No data to  display  EKG  EKG Interpretation None       Radiology No results found.  Procedures Procedures (including critical care time)  Medications Ordered in ED Medications - No data to display   Initial Impression / Assessment and Plan / ED Course  I have reviewed the triage vital signs and the nursing notes.  Pertinent labs & imaging results that were available during my care of the patient were reviewed by me and considered in my medical decision making (see chart for details).     **I have reviewed nursing notes, vital signs, and all appropriate lab and imaging results for this patient.*  Final Clinical Impressions(s) / ED Diagnoses  Patient has a history of anxiety and panic attacks. She has not had her medication for 2 or 3 weeks. She  presents to the emergency department tonight for assistance with her medication.  The patient denies suicidal or homicidal ideations. When ask why she is not seeing her primary physician she did not have an answer for that at this time. She states that her last panic episode was today. She feels as though she is having shortness of breath, palpitations, and sometimes a sense of doom.  The patient will be given on 8 tablets of Xanax, and 8 tablets of Klonopin. I strongly advised the patient to see her primary physician on Monday, January 22. Patient and family member acknowledge understanding of these discharge instructions.    Final diagnoses:  None    New Prescriptions New Prescriptions   No medications on file     Ivery Quale, PA-C 05/24/16 0017    Vanetta Mulders, MD 05/28/16 1744

## 2016-05-23 NOTE — ED Triage Notes (Signed)
Pt c/o shaking and feels panicky since Saturday. Pt states she has been out of xanax and clonidine for 2-3 weeks.

## 2016-05-24 MED ORDER — LORAZEPAM 1 MG PO TABS
1.0000 mg | ORAL_TABLET | Freq: Once | ORAL | Status: AC
  Administered 2016-05-24: 1 mg via ORAL
  Filled 2016-05-24: qty 1

## 2016-05-24 MED ORDER — CLONAZEPAM 0.5 MG PO TABS
0.5000 mg | ORAL_TABLET | Freq: Once | ORAL | Status: AC
  Administered 2016-05-24: 0.5 mg via ORAL
  Filled 2016-05-24: qty 1

## 2016-05-24 MED ORDER — ALPRAZOLAM 1 MG PO TABS: 1.0000 mg | ORAL_TABLET | Freq: Two times a day (BID) | ORAL | 0 refills | Status: AC

## 2016-05-24 MED ORDER — CLONAZEPAM 0.5 MG PO TABS: 0.5000 mg | ORAL_TABLET | Freq: Two times a day (BID) | ORAL | 0 refills | Status: AC | PRN

## 2016-05-24 NOTE — Discharge Instructions (Signed)
You have been given medication for your panic and anxiety over the weekend. It is strongly suggested that she see Dr. Margo AyeHall or a member of his team to continue your medication.

## 2016-07-19 ENCOUNTER — Other Ambulatory Visit: Payer: Self-pay | Admitting: Physician Assistant

## 2016-07-19 DIAGNOSIS — Z4789 Encounter for other orthopedic aftercare: Secondary | ICD-10-CM

## 2016-08-05 ENCOUNTER — Ambulatory Visit
Admission: RE | Admit: 2016-08-05 | Discharge: 2016-08-05 | Disposition: A | Discharge status: Home / Self Care | Payer: BLUE CROSS/BLUE SHIELD | Source: Ambulatory Visit | Attending: Physician Assistant | Admitting: Physician Assistant

## 2016-08-05 DIAGNOSIS — Z4789 Encounter for other orthopedic aftercare: Secondary | ICD-10-CM

## 2017-08-03 IMAGING — CT CT L SPINE W/O CM
2 of 7 series · 9 of 20 positions shown, 11 images · non-contrast
Comparison: Plain films 04/21/2015.

CLINICAL DATA: Back spasms and cramping. No leg issues. Difficulty
standing.

EXAM:
CT LUMBAR SPINE WITHOUT CONTRAST
TECHNIQUE: Multidetector CT imaging of the lumbar spine was performed without
intravenous contrast administration. Multiplanar CT image
reconstructions were also generated.

[Series 2: l spine soft (person_name) · axial · 0.27mm/px · z∈[-233,-59]mm · 6 of 81 slices shown, 8 images]
[im 12/81  soft-tissue]
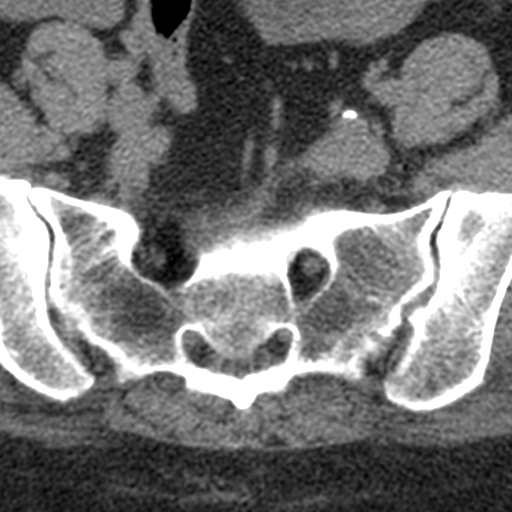
[im 12/81  bone]
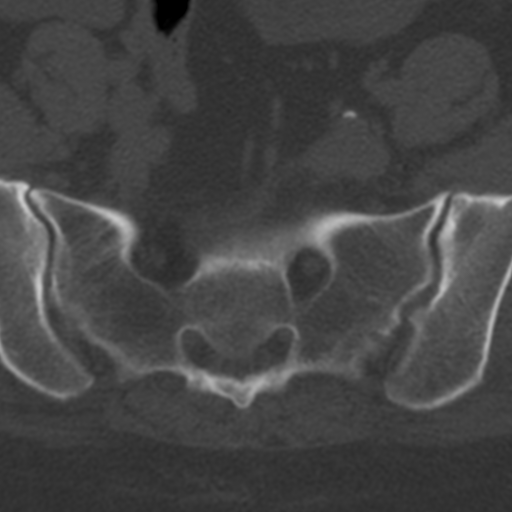
[im 23/81  bone]
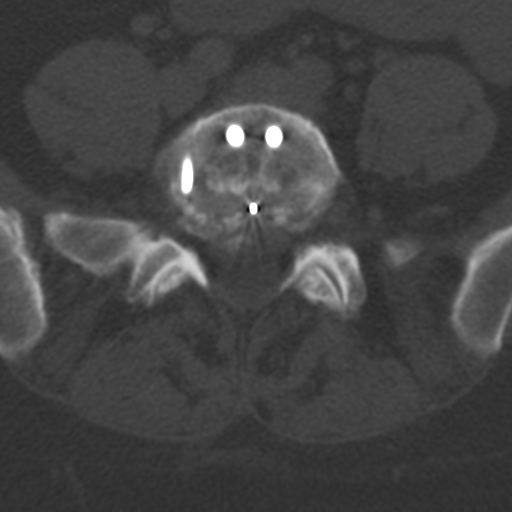
[im 35/81  bone]
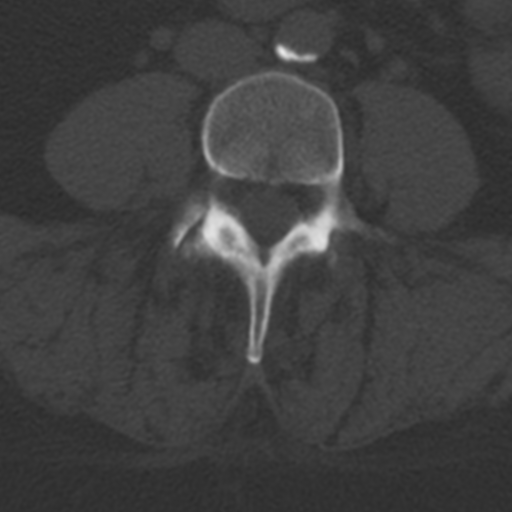
[im 46/81  bone]
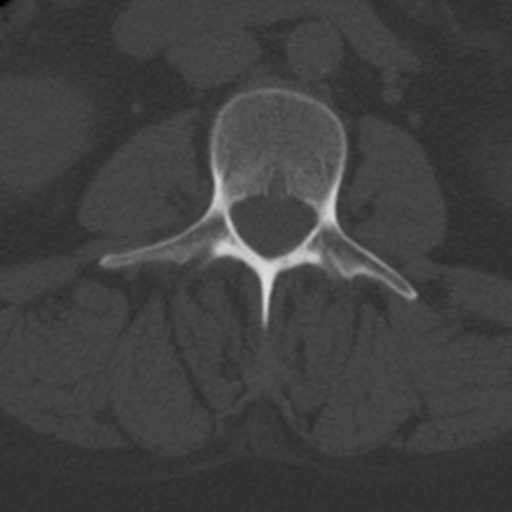
[im 58/81  soft-tissue]
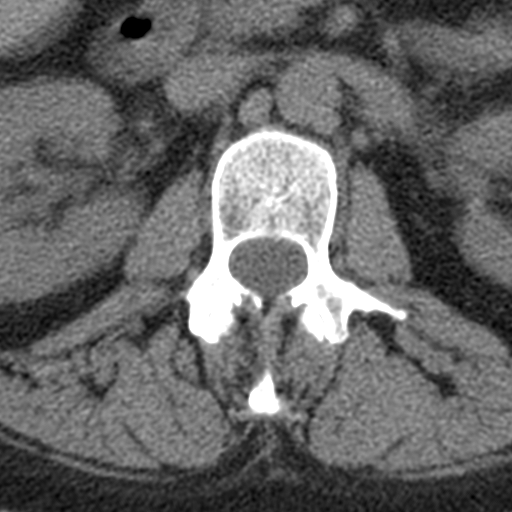
[im 58/81  bone]
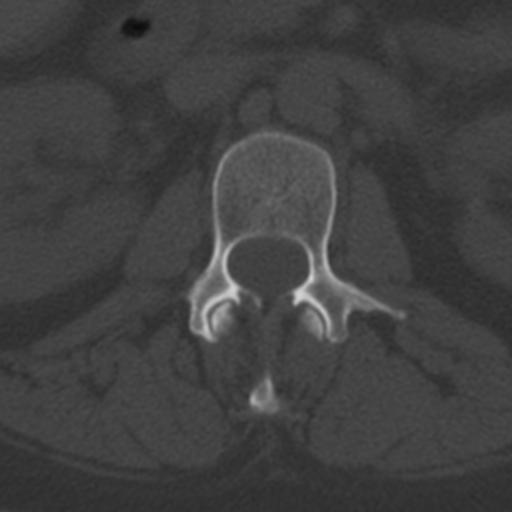
[im 69/81  bone]
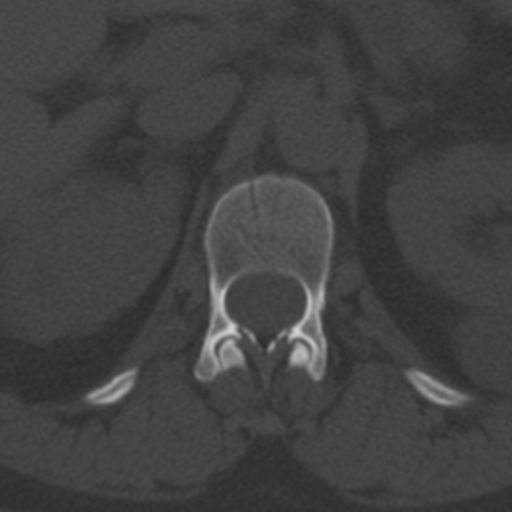

[Series 607: cor lower spine · coronal · 0.28mm/px · 3 of 47 slices shown]
[im 10/47  bone]
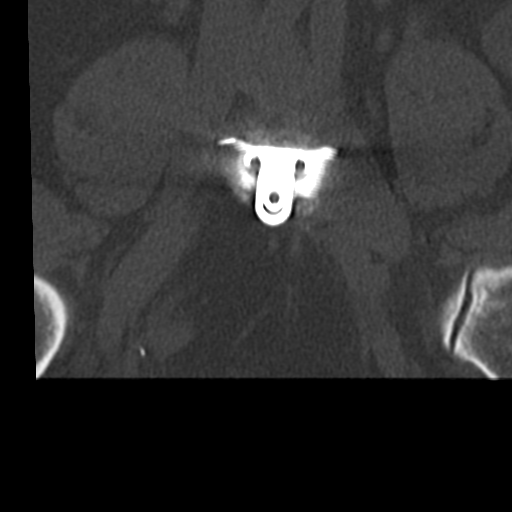
[im 19/47  bone]
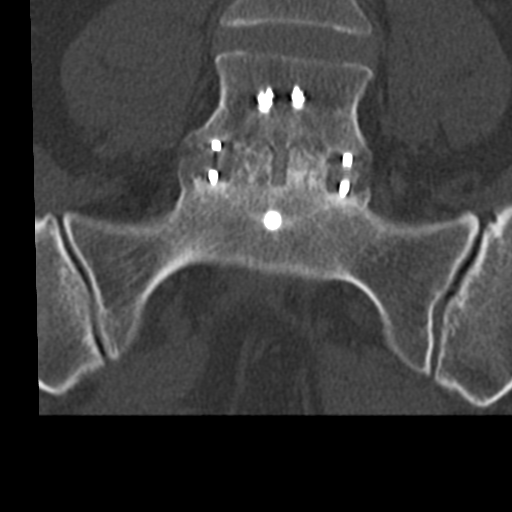
[im 28/47  bone]
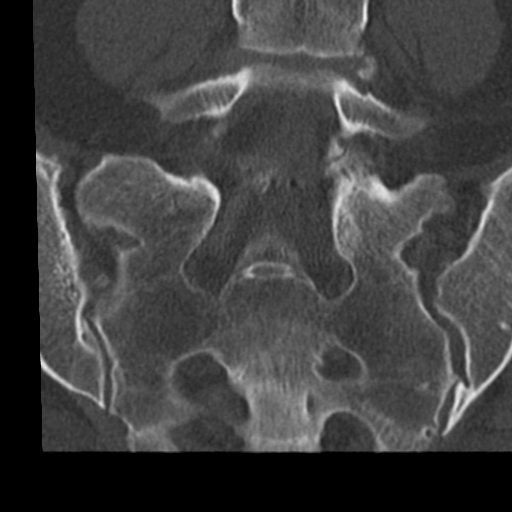

[9 of 20 positions shown; findings below may reference images not displayed]

FINDINGS: Segmentation: Standard

Alignment: Normal

Vertebrae: No lumbar compression deformity. There is a apparent
healed T12 compression fracture, loss of 50% vertebral body height
anteriorly. This was noted previously.

Paraspinal and other soft tissues: Aortic atherosclerosis.
Cholecystectomy.

Disc levels: The L1-2, L2-3, and L3-4 levels appear normal.

At L4-5 there is a broad-based disc protrusion in association with
posterior element hypertrophy. Calcified extraforaminal component is
seen on the LEFT. BILATERAL L5 and LEFT greater than RIGHT L4 nerve
root impingement are possible.

At L5-S1, there has been anterior lumbar interbody fusion. Hardware
appears intact. There appears to be solid interbody arthrodesis.
Posterior interbody arthrodesis across the facets is less well
established. Bony overgrowth contributes to RIGHT subarticular zone
narrowing and BILATERAL foraminal narrowing, RIGHT greater than
LEFT.
IMPRESSION: Status post ALIF at L5-S1.  Solid interbody arthrodesis is noted.

Healed chronic T12 compression fracture without retropulsion.

Bony overgrowth contributes to RIGHT subarticular zone narrowing and
foraminal narrowing at L5-S1, RIGHT greater than LEFT.

Adjacent segment disease at L4-5. Central protrusion with posterior
element hypertrophy contributes to BILATERAL L5 and LEFT greater
than RIGHT L4 nerve root impingement.

## 2018-06-10 ENCOUNTER — Inpatient Hospital Stay: Payer: MEDICAID | Primary: Family

## 2018-06-10 DIAGNOSIS — Z0000 Encounter for general adult medical examination without abnormal findings: Secondary | ICD-10-CM

## 2018-06-10 LAB — COMPREHENSIVE METABOLIC PANEL
ALT: 10 U/L (ref 5–33)
AST: 12 U/L (ref <32)
Albumin: 3.8 g/dL (ref 3.5–5.2)
Alkaline Phosphatase: 101 U/L (ref 35–104)
Anion Gap: 12 mmol/L (ref 9–17)
BUN: 13 mg/dL (ref 6–20)
CO2: 24 mmol/L (ref 20–31)
Calcium: 8.5 mg/dL — ABNORMAL LOW (ref 8.6–10.4)
Chloride: 104 mmol/L (ref 98–107)
Creatinine: 0.58 mg/dL (ref 0.50–0.90)
GFR African American: >60 mL/min (ref >60)
GFR Non-African American: >60 mL/min (ref >60)
Glucose: 109 mg/dL — ABNORMAL HIGH (ref 70–99)
Potassium: 4.3 mmol/L (ref 3.7–5.3)
Sodium: 140 mmol/L (ref 135–144)
Total Bilirubin: <0.15 mg/dL — ABNORMAL LOW (ref 0.3–1.2)
Total Protein: 7.1 g/dL (ref 6.4–8.3)

## 2018-06-10 LAB — CBC WITH AUTO DIFFERENTIAL
Absolute Eos #: 0.3 10*3/uL (ref 0.0–0.4)
Absolute Lymph #: 1.8 10*3/uL (ref 1.0–4.8)
Absolute Mono #: 0.5 10*3/uL (ref 0.1–1.3)
Basophils Absolute: 0.1 10*3/uL (ref 0.0–0.2)
Basophils: 1 % (ref 0–2)
Eosinophils %: 4 % (ref 0–4)
Hematocrit: 42.2 % (ref 36–46)
Hemoglobin: 14.1 g/dL (ref 12.0–16.0)
Lymphocytes: 24 % (ref 24–44)
MCH: 31 pg (ref 26–34)
MCHC: 33.4 g/dL (ref 31–37)
MCV: 92.9 fL (ref 80–100)
MPV: 7.9 fL (ref 6.0–12.0)
Monocytes: 7 % (ref 1–7)
Platelets: 325 10*3/uL (ref 150–450)
RBC: 4.54 m/uL (ref 4.0–5.2)
RDW: 13.9 % (ref 11.5–14.9)
Seg Neutrophils: 64 % (ref 36–66)
Segs Absolute: 4.9 10*3/uL (ref 1.3–9.1)
WBC: 7.5 10*3/uL (ref 3.5–11.0)

## 2018-06-10 LAB — LIPID PANEL
Chol/HDL Ratio: 4.1 (ref <5)
Cholesterol: 222 mg/dL — ABNORMAL HIGH (ref <200)
HDL: 54 mg/dL (ref >40)
LDL Cholesterol: 155 mg/dL — ABNORMAL HIGH (ref 0–130)
Triglycerides: 67 mg/dL (ref <150)

## 2018-06-10 LAB — HEMOGLOBIN A1C
Estimated Avg Glucose: 111 mg/dL
Hemoglobin A1C: 5.5 % (ref 4.0–6.0)

## 2018-06-10 LAB — VITAMIN D 25 HYDROXY: Vit D, 25-Hydroxy: 17 ng/mL — ABNORMAL LOW (ref 30.0–100.0)

## 2018-06-10 LAB — HEPATITIS PANEL, ACUTE
Hep A IgM: NONREACTIVE
Hep B Core Ab, IgM: NONREACTIVE
Hepatitis B Surface Ag: NONREACTIVE
Hepatitis C Ab: NONREACTIVE

## 2018-06-10 LAB — T4, FREE: Thyroxine, Free: 0.87 ng/dL — ABNORMAL LOW (ref 0.93–1.70)

## 2018-06-10 LAB — TSH: TSH: 1.76 mIU/L (ref 0.30–5.00)

## 2018-10-01 ENCOUNTER — Inpatient Hospital Stay
Admit: 2018-10-01 | Discharge: 2018-10-01 | Disposition: A | Discharge status: Home / Self Care | Payer: MEDICAID | Attending: Emergency Medicine

## 2018-10-01 DIAGNOSIS — S61214A Laceration without foreign body of right ring finger without damage to nail, initial encounter: Secondary | ICD-10-CM

## 2018-10-01 MED ORDER — TETANUS-DIPHTH-ACELL PERTUSSIS 5-2.5-18.5 LF-MCG/0.5 IM SUSP
5-2.5-18.5-0.5 LF-MCG/0.5 | Freq: Once | INTRAMUSCULAR | Status: AC
Start: 2018-10-01 — End: 2018-10-01
  Administered 2018-10-01: 16:00:00 0.5 mL via INTRAMUSCULAR

## 2018-10-01 MED ORDER — BACITRACIN 500 UNIT/GM EX OINT
500 UNIT/GM | Freq: Once | CUTANEOUS | Status: AC
Start: 2018-10-01 — End: 2018-10-01
  Administered 2018-10-01: 16:00:00 via TOPICAL

## 2018-10-01 MED FILL — BOOSTRIX 5-2.5-18.5 LF-MCG/0.5 IM SUSP: 5-2.5-18.5 LF-MCG/0.5 | INTRAMUSCULAR | Qty: 0.5

## 2018-10-01 NOTE — ED Notes (Signed)
Daniel B Fought, DO in to repair laceration.     Bonner Puna, RN  10/01/18 1154

## 2018-10-01 NOTE — ED Notes (Signed)
Tube gauze dressing applied to right 4th digit.     Bonner Puna, RN  10/01/18 [phone]

## 2018-10-01 NOTE — ED Provider Notes (Signed)
[facility]  [phone] [facility] [address]  [facility] [place] [id number]  Phone: [phone]  Fax: [phone]    Pt Name: Kerry Griffin  MRN: [id number]  Birthdate [birth year]  Date of evaluation: 10/01/2018      CHIEF COMPLAINT       Chief Complaint   Patient presents with   ??? Laceration     right 4th finger, cut on awning, distal end         HISTORY OF PRESENT ILLNESS     Kerry Griffin is a 53 y.o. female who presents with a laceration of the right ring finger which he cut it on a sharp piece of metal just prior to admission.  Tetanus is not up-to-date.  Bleeding is controlled.  No foreign bodies are present.  No tendon injury.  No bony injury.  The laceration is to the tip of the finger.  5 out of a 10 pain at this time and refuses anything for it at this time        REVIEW OF SYSTEMS       Other ROS negative except as noted above.    PAST MEDICAL HISTORY    has a past medical history of Bipolar 1 disorder, manic, mild (HCC).    SURGICAL HISTORY      has a past surgical history that includes back surgery and hernia repair.    CURRENT MEDICATIONS       Previous Medications    CYCLOBENZAPRINE (FLEXERIL) 10 MG TABLET    Take 10 mg by mouth 3 times daily as needed for Muscle spasms    GABAPENTIN (NEURONTIN) 300 MG CAPSULE    Take 300 mg by mouth 2 times daily.    IBUPROFEN (ADVIL;MOTRIN) 600 MG TABLET    Take 600 mg by mouth every 6 hours as needed for Pain    OXCARBAZEPINE (TRILEPTAL) 600 MG TABLET    Take 600 mg by mouth 2 times daily    QUETIAPINE (SEROQUEL) 25 MG TABLET    Take 25 mg by mouth 2 times daily    ZIPRASIDONE (GEODON) 80 MG CAPSULE    Take 80 mg by mouth 2 times daily (with meals)    ZOLPIDEM (AMBIEN) 10 MG TABLET    Take by mouth nightly as needed for Sleep.       ALLERGIES     is allergic to tetracyclines & related and hydrocodone.    FAMILY HISTORY     has no family status information on file.      family history is not on file.    SOCIAL HISTORY      reports that she has been smoking  cigarettes. She has been smoking about 0.50 packs per day. She has never used smokeless tobacco. She reports current alcohol use of about 1.0 - 2.0 standard drinks of alcohol per week. She reports previous drug use.    PHYSICAL EXAM     INITIAL VITALS:  height is 5\' 4"  (1.626 m) and weight is 60.8 kg (134 lb). Her oral temperature is 98.1 ??F (36.7 ??C). Her blood pressure is 159/87 (abnormal) and her pulse is 85. Her respiration is 16 and oxygen saturation is 98%.     Constitutional: The patient is alert, well-developed, in no acute distress. Vital signs as noted.   Right hand demonstrates a 2 cm laceration noted to the tip of the finger.  No bleeding.  No foreign bodies.  No bony injury.  No tendon injury.  DIFFERENTIAL DIAGNOSIS/ MEDICAL DECISION MAKING:      The wound is sutured and given wound instructions to be used as directed.  Also given tetanus immunization    Follow Exit Care instructions closely.    I have reviewed the disposition diagnosis with the patient and or their family/guardian. I have answered their questions and given discharge instructions. They voiced understanding of these instructions and did not have any further questions or complaints.    DIAGNOSTIC RESULTS     RADIOLOGY:   Non-plain film images such as CT, Ultrasound and MRI are read by the radiologist. Plain radiographic images are visualized and preliminarily interpreted by the emergency physician with the below findings:  No orders to display         LABS:  No results found for this visit on 10/01/18.    ABNORMAL LABS:  Labs Reviewed - No data to display           EMERGENCY DEPARTMENT COURSE:   Vitals:    Vitals:    10/01/18 1023   BP: (!) 159/87   Pulse: 85   Resp: 16   Temp: 98.1 ??F (36.7 ??C)   TempSrc: Oral   SpO2: 98%   Weight: 60.8 kg (134 lb)   Height: 5\' 4"  (1.626 m)     -------------------------  BP: (!) 159/87, Temp: 98.1 ??F (36.7 ??C), Pulse: 85, Resp: 16    See DDX/MDM  (Differential Diagnosis/Medical Decision Making)  above.        Procedures:    The wound was prepped with Betadine infiltrated with 3 mL 1% Xylocaine plain.  Sterility maintained.  Wound was explored to its base and no foreign bodies are found.  There is no neurovascular injury.  There is no tendon injury.  The wound is copiously irrigated and carefully debrided.  The wound is sutured with 5-0 nylon.  Bacitracin dressing is placed.  Tolerated the procedure well.  I performed the procedure        FINAL IMPRESSION      1. Laceration of right ring finger without foreign body without damage to nail, initial encounter          DISPOSITION/PLAN   DISPOSITION Decision To Discharge 10/01/2018 11:09:34 AM      Condition on Disposition    Fair    PATIENT REFERRED TO:  Vonzella Nipple, APRN - NP  [address]  [place] [address]  [phone]    In 4 days        DISCHARGE MEDICATIONS:  New Prescriptions    No medications on file       (Please note that portions of this note were completed with a voice recognition program.  Efforts were made to edit the dictations but occasionally words are mis-transcribed.)    [facility] [place] Niall Illes,, DO  Attending Emergency Physician       [facility] [place] Jesua Tamblyn, DO  10/01/18 1113

## 2018-11-16 NOTE — Telephone Encounter (Signed)
1st Attempt - Letter with questionnaire mailed to patient address.

## 2019-04-17 NOTE — Telephone Encounter (Signed)
Writer left msg on pt's vm asking pt to call back to sched GI referral.

## 2019-04-24 NOTE — Telephone Encounter (Signed)
Writer left msg on pt's vm asking pt to call back & sched colon GI referral.
